# Patient Record
Sex: Female | Born: 1970
Health system: Southern US, Community
[De-identification: ages and names within clinical notes are randomized; demographics above are authoritative.]

## PROBLEM LIST (undated history)

## (undated) DIAGNOSIS — K219 Gastro-esophageal reflux disease without esophagitis: Secondary | ICD-10-CM

## (undated) DIAGNOSIS — J45909 Unspecified asthma, uncomplicated: Secondary | ICD-10-CM

## (undated) DIAGNOSIS — T7840XA Allergy, unspecified, initial encounter: Secondary | ICD-10-CM

## (undated) DIAGNOSIS — G4730 Sleep apnea, unspecified: Secondary | ICD-10-CM

## (undated) DIAGNOSIS — G629 Polyneuropathy, unspecified: Secondary | ICD-10-CM

## (undated) DIAGNOSIS — G709 Myoneural disorder, unspecified: Secondary | ICD-10-CM

## (undated) DIAGNOSIS — E119 Type 2 diabetes mellitus without complications: Secondary | ICD-10-CM

## (undated) DIAGNOSIS — F419 Anxiety disorder, unspecified: Secondary | ICD-10-CM

## (undated) DIAGNOSIS — E785 Hyperlipidemia, unspecified: Secondary | ICD-10-CM

## (undated) HISTORY — PX: MANDIBLE FRACTURE SURGERY: SHX706

## (undated) HISTORY — DX: Myoneural disorder, unspecified: G70.9

## (undated) HISTORY — DX: Sleep apnea, unspecified: G47.30

## (undated) HISTORY — PX: CHOLECYSTECTOMY: SHX55

## (undated) HISTORY — PX: SINUSOTOMY: SHX291

## (undated) HISTORY — DX: Hyperlipidemia, unspecified: E78.5

## (undated) HISTORY — DX: Gastro-esophageal reflux disease without esophagitis: K21.9

## (undated) HISTORY — DX: Allergy, unspecified, initial encounter: T78.40XA

## (undated) HISTORY — DX: Type 2 diabetes mellitus without complications: E11.9

## (undated) HISTORY — DX: Anxiety disorder, unspecified: F41.9

## (undated) HISTORY — DX: Polyneuropathy, unspecified: G62.9

## (undated) HISTORY — PX: ABDOMINAL HYSTERECTOMY: SHX81

## (undated) HISTORY — DX: Unspecified asthma, uncomplicated: J45.909

---

## 2008-09-05 HISTORY — PX: TOTAL ABDOMINAL HYSTERECTOMY: SHX209

## 2019-10-23 ENCOUNTER — Other Ambulatory Visit (HOSPITAL_COMMUNITY): Payer: Self-pay | Admitting: Family Medicine

## 2019-12-04 MED FILL — TRULICITY 1.5 MG/0.5 ML PEN: 1.5 | 84 days supply | Qty: 6 | Fill #0

## 2019-12-04 MED FILL — PREGABALIN 100 MG CAPS: 100 | 90 days supply | Qty: 270 | Fill #0

## 2019-12-04 MED FILL — CITALOPRAM HBR 40 MG TABLET: 40 | 90 days supply | Qty: 90 | Fill #0

## 2019-12-04 MED FILL — FENOFIBRATE 145 MG TABS: 145 | 90 days supply | Qty: 90 | Fill #0

## 2020-02-11 MED FILL — FLUTICASONE PROP 50 MCG SPR: 50 | 59 days supply | Qty: 16 | Fill #0

## 2020-02-11 MED FILL — ALBUTEROL SULFATE HFA 108 (: 108 (90 BAS | 24 days supply | Qty: 18 | Fill #0

## 2020-02-20 ENCOUNTER — Ambulatory Visit: Payer: Self-pay | Admitting: Family Medicine

## 2020-03-27 MED FILL — CITALOPRAM HBR 40 MG TABLET: 40 | 90 days supply | Qty: 90 | Fill #1

## 2020-03-27 MED FILL — ALBUTEROL SULFATE HFA 108 (: 108 (90 BAS | 24 days supply | Qty: 18 | Fill #1

## 2020-03-27 MED FILL — TRULICITY 1.5 MG/0.5 ML PEN: 1.5 | 84 days supply | Qty: 6 | Fill #1

## 2020-03-27 MED FILL — FENOFIBRATE 145 MG TABS: 145 | 90 days supply | Qty: 90 | Fill #1

## 2020-03-27 MED FILL — PREGABALIN 100 MG CAPS: 100 | 90 days supply | Qty: 270 | Fill #1

## 2020-04-08 ENCOUNTER — Other Ambulatory Visit: Payer: Self-pay

## 2020-04-09 ENCOUNTER — Other Ambulatory Visit (INDEPENDENT_AMBULATORY_CARE_PROVIDER_SITE_OTHER): Payer: 59

## 2020-04-09 ENCOUNTER — Encounter: Payer: Self-pay | Admitting: Family Medicine

## 2020-04-09 ENCOUNTER — Other Ambulatory Visit: Payer: Self-pay | Admitting: Family Medicine

## 2020-04-09 ENCOUNTER — Ambulatory Visit (INDEPENDENT_AMBULATORY_CARE_PROVIDER_SITE_OTHER): Payer: 59 | Admitting: Family Medicine

## 2020-04-09 VITALS — BP 138/82 | HR 85 | Temp 97.9°F | Ht 66.5 in | Wt 214.4 lb

## 2020-04-09 DIAGNOSIS — E1169 Type 2 diabetes mellitus with other specified complication: Secondary | ICD-10-CM | POA: Diagnosis not present

## 2020-04-09 DIAGNOSIS — E785 Hyperlipidemia, unspecified: Secondary | ICD-10-CM | POA: Diagnosis not present

## 2020-04-09 DIAGNOSIS — E119 Type 2 diabetes mellitus without complications: Secondary | ICD-10-CM

## 2020-04-09 DIAGNOSIS — Z1321 Encounter for screening for nutritional disorder: Secondary | ICD-10-CM | POA: Diagnosis not present

## 2020-04-09 DIAGNOSIS — E559 Vitamin D deficiency, unspecified: Secondary | ICD-10-CM | POA: Insufficient documentation

## 2020-04-09 DIAGNOSIS — J0100 Acute maxillary sinusitis, unspecified: Secondary | ICD-10-CM

## 2020-04-09 DIAGNOSIS — Z0000 Encounter for general adult medical examination without abnormal findings: Secondary | ICD-10-CM

## 2020-04-09 DIAGNOSIS — Z1231 Encounter for screening mammogram for malignant neoplasm of breast: Secondary | ICD-10-CM

## 2020-04-09 LAB — BASIC METABOLIC PANEL
BUN: 14 mg/dL (ref 6–23)
CO2: 26 mEq/L (ref 19–32)
Calcium: 10 mg/dL (ref 8.4–10.5)
Chloride: 105 mEq/L (ref 96–112)
Creatinine, Ser: 1.03 mg/dL (ref 0.40–1.20)
GFR: 63.62 mL/min (ref >60.00)
Glucose, Bld: 115 mg/dL — ABNORMAL HIGH (ref 70–99)
Potassium: 3.9 mEq/L (ref 3.5–5.1)
Sodium: 139 mEq/L (ref 135–145)

## 2020-04-09 LAB — LIPID PANEL
Cholesterol: 186 mg/dL (ref 0–200)
HDL: 37.7 mg/dL — ABNORMAL LOW (ref >39.00)
NonHDL: 148.12
Total CHOL/HDL Ratio: 5
Triglycerides: 220 mg/dL — ABNORMAL HIGH (ref 0.0–149.0)
VLDL: 44 mg/dL — ABNORMAL HIGH (ref 0.0–40.0)

## 2020-04-09 LAB — HEMOGLOBIN A1C: Hgb A1c MFr Bld: 7.7 % — ABNORMAL HIGH (ref 4.6–6.5)

## 2020-04-09 LAB — MICROALBUMIN / CREATININE URINE RATIO
Creatinine,U: 138.1 mg/dL
Microalb Creat Ratio: 0.5 mg/g (ref 0.0–30.0)
Microalb, Ur: <0.7 mg/dL (ref 0.0–1.9)

## 2020-04-09 LAB — ALT: ALT: 18 U/L (ref 0–35)

## 2020-04-09 LAB — AST: AST: 16 U/L (ref 0–37)

## 2020-04-09 LAB — CBC
HCT: 39.5 % (ref 36.0–46.0)
Hemoglobin: 13.4 g/dL (ref 12.0–15.0)
MCHC: 34 g/dL (ref 30.0–36.0)
MCV: 82.6 fl (ref 78.0–100.0)
Platelets: 282 10*3/uL (ref 150.0–400.0)
RBC: 4.78 Mil/uL (ref 3.87–5.11)
RDW: 13.4 % (ref 11.5–15.5)
WBC: 9.6 10*3/uL (ref 4.0–10.5)

## 2020-04-09 LAB — LDL CHOLESTEROL, DIRECT: Direct LDL: 114 mg/dL

## 2020-04-09 LAB — VITAMIN D 25 HYDROXY (VIT D DEFICIENCY, FRACTURES): VITD: 24.37 ng/mL — ABNORMAL LOW (ref 30.00–100.00)

## 2020-04-09 MED ORDER — AZITHROMYCIN 250 MG PO TABS: ORAL_TABLET | ORAL | 0 refills | Status: DC

## 2020-04-09 MED ORDER — PREDNISONE 20 MG PO TABS: 40.0000 mg | ORAL_TABLET | Freq: Every day | ORAL | 0 refills | Status: DC

## 2020-04-09 MED ORDER — TRULICITY 3 MG/0.5ML ~~LOC~~ SOAJ: 3.0000 mg | SUBCUTANEOUS | 3 refills | Status: DC

## 2020-04-09 MED ORDER — VITAMIN D (ERGOCALCIFEROL) 1.25 MG (50000 UNIT) PO CAPS: 50000.0000 [IU] | ORAL_CAPSULE | ORAL | 2 refills | Status: DC

## 2020-04-09 MED FILL — AZITHROMYCIN 250 MG TABLET: 250 | 5 days supply | Qty: 6 | Fill #0

## 2020-04-09 MED FILL — VIT D2 1.25 MG (50,000 UNIT: 1.25 MG | 28 days supply | Qty: 4 | Fill #0

## 2020-04-09 MED FILL — TRULICITY 3 MG/0.5ML SOPN: 3 | 84 days supply | Qty: 6 | Fill #0

## 2020-04-09 MED FILL — predniSONE 20 MG TABS: 20 | 5 days supply | Qty: 10 | Fill #0

## 2020-04-09 NOTE — Progress Notes (Signed)
Carolyn [name] is a 50 y.o. female  Chief Complaint  Patient presents with  . Establish Care    NP- CPE/labs.  Fasting today.  C/o having sinus pressure, nasal congestion x 8 days.         HPI: Carolyn [name] is a 50 y.o. female here as a new patient to establish care with our office and for annual CPE, fasting labs.   Last PAP: s/p TAH Last mammo: overdue Last colonoscopy: 2010 - would like to do cologuard  Dental: due - has not been since covid but plans for appt in 04/2020 Vision: appt scheduled in 05/2020  Med refills needed today? none  Today, pt complains of 8 day h/o nasal congestion, sinus pressure. No fever, chills. No loss of taste or smell. No sore throat, headache, myalgias, GI symptoms. Pt did have a cough lst week that has improved. She has been taking flonase, benadryl allergy, sinus rinse.  Other family members with symptoms - all covid negative and all diagnosed with sinus infections. Pt has not been tested. Pt lives with her mother.   Past Medical History:  Diagnosis Date  . Asthma   . Diabetes mellitus without complication ([place])   . Hyperlipidemia   . Neuropathy     Past Surgical History:  Procedure Laterality Date  . ABDOMINAL HYSTERECTOMY    . CHOLECYSTECTOMY    . MANDIBLE FRACTURE SURGERY    . SINUSOTOMY     x2    Social History   Socioeconomic History  . Marital status: Single    Spouse name: Not on file  . Number of children: Not on file  . Years of education: Not on file  . Highest education level: Not on file  Occupational History  . Not on file  Tobacco Use  . Smoking status: Never Smoker  . Smokeless tobacco: Never Used  Vaping Use  . Vaping Use: Never used  Substance and Sexual Activity  . Alcohol use: Yes  . Drug use: Never  . Sexual activity: Yes  Other Topics Concern  . Not on file  Social History Narrative  . Not on file   Social Determinants of Health   Financial Resource Strain: Not on file  Food Insecurity: Not on  file  Transportation Needs: Not on file  Physical Activity: Not on file  Stress: Not on file  Social Connections: Not on file  Intimate Partner Violence: Not on file    Family History  Problem Relation Age of Onset  . Depression Mother   . Hyperlipidemia Mother   . Cancer Father        renal cancer     Immunization History  Administered Date(s) Administered  . PFIZER SARS-COV-2 Vaccination 03/12/2019, 04/11/2019    Outpatient Encounter Medications as of 04/09/2020  Medication Sig  . albuterol (VENTOLIN HFA) 108 (90 Base) MCG/ACT inhaler Inhale into the lungs.  . Blood Glucose Monitoring Suppl (GLUCOCOM BLOOD GLUCOSE MONITOR) DEVI USE TO CHECK BLOOD SUGAR  . citalopram (CELEXA) 40 MG tablet Take 40 mg by mouth daily.  . fenofibrate (TRICOR) 145 MG tablet Take 145 mg by mouth daily.  . fluticasone (FLONASE) 50 MCG/ACT nasal spray   . glucose blood (PRECISION QID TEST) test strip USE CONTOUR NEXT TEST STRIPS TO CHECK BLOOD SUGAR ONCE DAILY  . Lancets MISC by Does not apply route.  . pregabalin (LYRICA) 100 MG capsule Take 100 mg by mouth 3 (three) times daily.  . TRULICITY 1.5 SH/7.50YO SOPN Inject 1.5 mg  into the skin once a week.   No facility-administered encounter medications on file as of 04/09/2020.   Immunization History  Administered Date(s) Administered  . PFIZER SARS-COV-2 Vaccination 03/12/2019, 04/11/2019    ROS: Gen: no fever, chills  Skin: no rash, itching ENT: as above in HPI Eyes: no blurry vision, double vision Resp: no cough, wheeze,SOB Breast: no breast tenderness, no nipple discharge, no breast masses CV: no CP, palpitations, LE edema,  GI: no heartburn, n/v/d/c, abd pain GU: no dysuria, urgency, frequency, hematuria; no vaginal itching, odor, discharge MSK: no joint pain, myalgias, back pain Neuro: no dizziness, headache, weakness, vertigo Psych: no depression, anxiety, insomnia   Allergies  Allergen Reactions  . Statins     Stomach cramps/ SOB     BP 138/82   Pulse 85   Temp 97.9 F (36.6 C) (Temporal)   Ht 5' 6.5" (1.689 m)   Wt 214 lb 6.4 oz (97.3 kg)   SpO2 94%   BMI 34.09 kg/m   Physical Exam Constitutional:      General: She is not in acute distress.    Appearance: She is well-developed and well-nourished.  HENT:     Head: Normocephalic and atraumatic.     Right Ear: Tympanic membrane and ear canal normal.     Left Ear: Tympanic membrane and ear canal normal.     Nose: Congestion present.     Right Turbinates: Swollen.     Left Turbinates: Swollen.     Right Sinus: Maxillary sinus tenderness present.     Left Sinus: Maxillary sinus tenderness present.     Mouth/Throat:     Mouth: Oropharynx is clear and moist and mucous membranes are normal.  Eyes:     Conjunctiva/sclera: Conjunctivae normal.     Pupils: Pupils are equal, round, and reactive to light.  Neck:     Thyroid: No thyromegaly.  Cardiovascular:     Rate and Rhythm: Normal rate and regular rhythm.     Pulses: Intact distal pulses.     Heart sounds: Normal heart sounds. No murmur heard.   Pulmonary:     Effort: Pulmonary effort is normal. No respiratory distress.     Breath sounds: Normal breath sounds. No wheezing or rhonchi.  Abdominal:     General: Bowel sounds are normal. There is no distension.     Palpations: Abdomen is soft. There is no mass.     Tenderness: There is no abdominal tenderness.  Musculoskeletal:        General: No edema.     Cervical back: Neck supple.     Right lower leg: No edema.     Left lower leg: No edema.  Lymphadenopathy:     Cervical: No cervical adenopathy.  Skin:    General: Skin is warm and dry.  Neurological:     Mental Status: She is alert and oriented to person, place, and time.     Motor: No abnormal muscle tone.     Coordination: Coordination normal.  Psychiatric:        Mood and Affect: Mood and affect normal.        Behavior: Behavior normal.      A/P:  1. Annual physical exam - discussed  importance of regular CV exercise, healthy diet, adequate sleep - due for mammo - referral placed today; due for colonoscopy but pt declines but agrees to cologuard; s/p TAH - dental and vision appts scheduled - ALT - AST - Basic metabolic panel - CBC -  Lipid panel - next CPE in 1 year  2. Encounter for vitamin deficiency screening - VITAMIN D 25 Hydroxy (Vit-D Deficiency, Fractures)  3. Type 2 diabetes mellitus without complication, without long-term current use of insulin (HCC) - last A1C about 6 mo ago and pt reports it was around 8.0 - for now continue trulicity 1.5mg  weekly - Lipid panel - Hemoglobin A1c - Microalbumin / creatinine urine ratio  4. Hyperlipidemia associated with type 2 diabetes mellitus ([place]) - on fenofibrate 145mg  daily, unable to tolerate statin - Lipid panel  5. Encounter for screening mammogram for malignant neoplasm of breast - MM DIGITAL SCREENING BILATERAL; Future  6. Acute non-recurrent maxillary sinusitis - symptoms x 8 days, minimal improvement - pt has h/o sinus issues but states this amount of sinus pressure is unusual for her - strongly encouraged pt get tested for covid since she lives with her elderly mother - discussed local testing locations - cont with flonase Rx: - predniSONE (DELTASONE) 20 MG tablet; Take 2 tablets (40 mg total) by mouth daily with breakfast for 5 days.  Dispense: 10 tablet; Refill: 0 - azithromycin (ZITHROMAX) 250 MG tablet; 2 tabs po x 1 day and then 1 tab po daily  Dispense: 6 tablet; Refill: 0 Discussed plan and reviewed medications with patient, including risks, benefits, and potential side effects. Pt expressed understand. All questions answered.   This visit occurred during the SARS-CoV-2 public health emergency.  Safety protocols were in place, including screening questions prior to the visit, additional usage of staff PPE, and extensive cleaning of exam room while observing appropriate contact time as indicated  for disinfecting solutions.

## 2020-04-09 NOTE — Patient Instructions (Signed)
Health Maintenance, Female Adopting a healthy lifestyle and getting preventive care are important in promoting health and wellness. Ask your health care provider about:  The right schedule for you to have regular tests and exams.  Things you can do on your own to prevent diseases and keep yourself healthy. What should I know about diet, weight, and exercise? Eat a healthy diet  Eat a diet that includes plenty of vegetables, fruits, low-fat dairy products, and lean protein.  Do not eat a lot of foods that are high in solid fats, added sugars, or sodium.   Maintain a healthy weight Body mass index (BMI) is used to identify weight problems. It estimates body fat based on height and weight. Your health care provider can help determine your BMI and help you achieve or maintain a healthy weight. Get regular exercise Get regular exercise. This is one of the most important things you can do for your health. Most adults should:  Exercise for at least 150 minutes each week. The exercise should increase your heart rate and make you sweat (moderate-intensity exercise).  Do strengthening exercises at least twice a week. This is in addition to the moderate-intensity exercise.  Spend less time sitting. Even light physical activity can be beneficial. Watch cholesterol and blood lipids Have your blood tested for lipids and cholesterol at 50 years of age, then have this test every 5 years. Have your cholesterol levels checked more often if:  Your lipid or cholesterol levels are high.  You are older than 50 years of age.  You are at high risk for heart disease. What should I know about cancer screening? Depending on your health history and family history, you may need to have cancer screening at various ages. This may include screening for:  Breast cancer.  Cervical cancer.  Colorectal cancer.  Skin cancer.  Lung cancer. What should I know about heart disease, diabetes, and high blood  pressure? Blood pressure and heart disease  High blood pressure causes heart disease and increases the risk of stroke. This is more likely to develop in people who have high blood pressure readings, are of African descent, or are overweight.  Have your blood pressure checked: ? Every 3-5 years if you are 18-39 years of age. ? Every year if you are 40 years old or older. Diabetes Have regular diabetes screenings. This checks your fasting blood sugar level. Have the screening done:  Once every three years after age 40 if you are at a normal weight and have a low risk for diabetes.  More often and at a younger age if you are overweight or have a high risk for diabetes. What should I know about preventing infection? Hepatitis B If you have a higher risk for hepatitis B, you should be screened for this virus. Talk with your health care provider to find out if you are at risk for hepatitis B infection. Hepatitis C Testing is recommended for:  Everyone born from 1945 through 1965.  Anyone with known risk factors for hepatitis C. Sexually transmitted infections (STIs)  Get screened for STIs, including gonorrhea and chlamydia, if: ? You are sexually active and are younger than 50 years of age. ? You are older than 50 years of age and your health care provider tells you that you are at risk for this type of infection. ? Your sexual activity has changed since you were last screened, and you are at increased risk for chlamydia or gonorrhea. Ask your health care provider   if you are at risk.  Ask your health care provider about whether you are at high risk for HIV. Your health care provider may recommend a prescription medicine to help prevent HIV infection. If you choose to take medicine to prevent HIV, you should first get tested for HIV. You should then be tested every 3 months for as long as you are taking the medicine. Pregnancy  If you are about to stop having your period (premenopausal) and  you may become pregnant, seek counseling before you get pregnant.  Take 400 to 800 micrograms (mcg) of folic acid every day if you become pregnant.  Ask for birth control (contraception) if you want to prevent pregnancy. Osteoporosis and menopause Osteoporosis is a disease in which the bones lose minerals and strength with aging. This can result in bone fractures. If you are 65 years old or older, or if you are at risk for osteoporosis and fractures, ask your health care provider if you should:  Be screened for bone loss.  Take a calcium or vitamin D supplement to lower your risk of fractures.  Be given hormone replacement therapy (HRT) to treat symptoms of menopause. Follow these instructions at home: Lifestyle  Do not use any products that contain nicotine or tobacco, such as cigarettes, e-cigarettes, and chewing tobacco. If you need help quitting, ask your health care provider.  Do not use street drugs.  Do not share needles.  Ask your health care provider for help if you need support or information about quitting drugs. Alcohol use  Do not drink alcohol if: ? Your health care provider tells you not to drink. ? You are pregnant, may be pregnant, or are planning to become pregnant.  If you drink alcohol: ? Limit how much you use to 0-1 drink a day. ? Limit intake if you are breastfeeding.  Be aware of how much alcohol is in your drink. In the [place] one drink equals one 12 oz bottle of beer (355 mL), one 5 oz glass of wine (148 mL), or one 1 oz glass of hard liquor (44 mL). General instructions  Schedule regular health, dental, and eye exams.  Stay current with your vaccines.  Tell your health care provider if: ? You often feel depressed. ? You have ever been abused or do not feel safe at home. Summary  Adopting a healthy lifestyle and getting preventive care are important in promoting health and wellness.  Follow your health care provider's instructions about healthy  diet, exercising, and getting tested or screened for diseases.  Follow your health care provider's instructions on monitoring your cholesterol and blood pressure. This information is not intended to replace advice given to you by your health care provider. Make sure you discuss any questions you have with your health care provider. Document Revised: 03/08/2018 Document Reviewed: 03/08/2018 Elsevier Patient Education  2021 Elsevier Inc.  

## 2020-04-09 NOTE — Addendum Note (Signed)
Addended by: Overton Mam on: 04/09/2020 09:49 AM   Modules accepted: Orders

## 2020-05-26 ENCOUNTER — Encounter: Payer: Self-pay | Admitting: Family Medicine

## 2020-05-26 ENCOUNTER — Other Ambulatory Visit: Payer: Self-pay

## 2020-05-26 NOTE — Telephone Encounter (Signed)
Pt requesting refill on flonase. Chart supports Rx. Pt last seen 04/09/20.

## 2020-05-28 MED ORDER — FLUTICASONE PROPIONATE 50 MCG/ACT NA SUSP: 2.0000 | Freq: Every day | NASAL | 3 refills | Status: DC

## 2020-06-04 ENCOUNTER — Other Ambulatory Visit: Payer: Self-pay | Admitting: Family Medicine

## 2020-06-04 ENCOUNTER — Other Ambulatory Visit: Payer: Self-pay

## 2020-06-04 MED ORDER — FLUTICASONE PROPIONATE 50 MCG/ACT NA SUSP: 2.0000 | Freq: Every day | NASAL | 3 refills | Status: DC

## 2020-06-04 MED FILL — FLUTICASONE PROP 50 MCG SPR: 50 | 30 days supply | Qty: 16 | Fill #0

## 2020-06-19 ENCOUNTER — Other Ambulatory Visit (HOSPITAL_BASED_OUTPATIENT_CLINIC_OR_DEPARTMENT_OTHER): Payer: Self-pay

## 2020-06-20 ENCOUNTER — Other Ambulatory Visit (HOSPITAL_BASED_OUTPATIENT_CLINIC_OR_DEPARTMENT_OTHER): Payer: Self-pay

## 2020-06-27 ENCOUNTER — Other Ambulatory Visit: Payer: Self-pay | Admitting: Family Medicine

## 2020-06-27 MED FILL — CITALOPRAM HBR 40 MG TABLET: 40 | 90 days supply | Qty: 90 | Fill #0

## 2020-06-27 MED FILL — FENOFIBRATE 145 MG TABS: 145 | 90 days supply | Qty: 90 | Fill #0

## 2020-06-27 MED FILL — TRULICITY 3 MG/0.5ML SOPN: 3 | 84 days supply | Qty: 6 | Fill #1

## 2020-06-27 MED FILL — ALBUTEROL SULFATE HFA 108 (: 108 (90 BAS | 24 days supply | Qty: 18 | Fill #2

## 2020-06-28 ENCOUNTER — Other Ambulatory Visit (HOSPITAL_COMMUNITY): Payer: Self-pay

## 2020-06-28 MED FILL — Pregabalin Cap 100 MG: ORAL | 90 days supply | Qty: 270 | Fill #0 | Status: AC

## 2020-06-29 ENCOUNTER — Other Ambulatory Visit (HOSPITAL_COMMUNITY): Payer: Self-pay

## 2020-06-30 ENCOUNTER — Other Ambulatory Visit (HOSPITAL_COMMUNITY): Payer: Self-pay

## 2020-07-02 ENCOUNTER — Other Ambulatory Visit (HOSPITAL_COMMUNITY): Payer: Self-pay

## 2020-07-03 ENCOUNTER — Other Ambulatory Visit (HOSPITAL_COMMUNITY): Payer: Self-pay

## 2020-08-11 MED FILL — Fluticasone Propionate Nasal Susp 50 MCG/ACT: NASAL | 30 days supply | Qty: 16 | Fill #0 | Status: AC

## 2020-08-12 ENCOUNTER — Other Ambulatory Visit (HOSPITAL_COMMUNITY): Payer: Self-pay

## 2020-09-18 ENCOUNTER — Other Ambulatory Visit: Payer: Self-pay | Admitting: Family Medicine

## 2020-09-18 ENCOUNTER — Other Ambulatory Visit (HOSPITAL_COMMUNITY): Payer: Self-pay

## 2020-09-18 MED ORDER — ALBUTEROL SULFATE HFA 108 (90 BASE) MCG/ACT IN AERS
INHALATION_SPRAY | RESPIRATORY_TRACT | 0 refills | Status: DC
  Filled 2020-09-18: qty 54, 75d supply, fill #0

## 2020-09-18 MED FILL — Fluticasone Propionate Nasal Susp 50 MCG/ACT: NASAL | 30 days supply | Qty: 16 | Fill #1 | Status: AC

## 2020-09-18 MED FILL — Fenofibrate Tab 145 MG: ORAL | 90 days supply | Qty: 90 | Fill #0 | Status: AC

## 2020-09-18 MED FILL — Citalopram Hydrobromide Tab 40 MG (Base Equiv): ORAL | 90 days supply | Qty: 90 | Fill #0 | Status: AC

## 2020-09-18 MED FILL — Dulaglutide Soln Auto-injector 3 MG/0.5ML: SUBCUTANEOUS | 84 days supply | Qty: 6 | Fill #0 | Status: AC

## 2020-10-06 ENCOUNTER — Other Ambulatory Visit (HOSPITAL_COMMUNITY): Payer: Self-pay

## 2020-10-31 ENCOUNTER — Ambulatory Visit: Payer: 59 | Admitting: Medical-Surgical

## 2020-10-31 ENCOUNTER — Other Ambulatory Visit (HOSPITAL_COMMUNITY): Payer: Self-pay

## 2020-10-31 ENCOUNTER — Encounter: Payer: Self-pay | Admitting: Medical-Surgical

## 2020-10-31 ENCOUNTER — Other Ambulatory Visit: Payer: Self-pay

## 2020-10-31 VITALS — BP 133/89 | HR 78 | Resp 20 | Wt 216.7 lb

## 2020-10-31 DIAGNOSIS — Z7689 Persons encountering health services in other specified circumstances: Secondary | ICD-10-CM | POA: Diagnosis not present

## 2020-10-31 DIAGNOSIS — E559 Vitamin D deficiency, unspecified: Secondary | ICD-10-CM

## 2020-10-31 DIAGNOSIS — Z23 Encounter for immunization: Secondary | ICD-10-CM

## 2020-10-31 DIAGNOSIS — Z1329 Encounter for screening for other suspected endocrine disorder: Secondary | ICD-10-CM

## 2020-10-31 DIAGNOSIS — F418 Other specified anxiety disorders: Secondary | ICD-10-CM | POA: Diagnosis not present

## 2020-10-31 DIAGNOSIS — E1169 Type 2 diabetes mellitus with other specified complication: Secondary | ICD-10-CM

## 2020-10-31 DIAGNOSIS — E1159 Type 2 diabetes mellitus with other circulatory complications: Secondary | ICD-10-CM

## 2020-10-31 DIAGNOSIS — Z1231 Encounter for screening mammogram for malignant neoplasm of breast: Secondary | ICD-10-CM | POA: Diagnosis not present

## 2020-10-31 DIAGNOSIS — I152 Hypertension secondary to endocrine disorders: Secondary | ICD-10-CM | POA: Diagnosis not present

## 2020-10-31 DIAGNOSIS — K219 Gastro-esophageal reflux disease without esophagitis: Secondary | ICD-10-CM

## 2020-10-31 DIAGNOSIS — E785 Hyperlipidemia, unspecified: Secondary | ICD-10-CM | POA: Diagnosis not present

## 2020-10-31 DIAGNOSIS — E119 Type 2 diabetes mellitus without complications: Secondary | ICD-10-CM

## 2020-10-31 HISTORY — DX: Hypertension secondary to endocrine disorders: I15.2

## 2020-10-31 HISTORY — DX: Type 2 diabetes mellitus with other circulatory complications: E11.59

## 2020-10-31 HISTORY — DX: Other specified anxiety disorders: F41.8

## 2020-10-31 LAB — POCT GLYCOSYLATED HEMOGLOBIN (HGB A1C): HbA1c, POC (controlled diabetic range): 9.8 % — AB (ref 0.0–7.0)

## 2020-10-31 MED ORDER — SEMAGLUTIDE 14 MG PO TABS
1.0000 | ORAL_TABLET | Freq: Every day | ORAL | 11 refills | Status: DC
  Filled 2020-10-31: qty 30, 30d supply, fill #0
  Filled 2020-11-26: qty 30, 30d supply, fill #1
  Filled 2020-12-28: qty 30, 30d supply, fill #2
  Filled 2021-01-25: qty 30, 30d supply, fill #3
  Filled 2021-02-27: qty 30, 30d supply, fill #4
  Filled 2021-04-04: qty 30, 30d supply, fill #5
  Filled 2021-05-03: qty 30, 30d supply, fill #6
  Filled 2021-06-03: qty 30, 30d supply, fill #7
  Filled 2021-07-08: qty 30, 30d supply, fill #8

## 2020-10-31 MED ORDER — PANTOPRAZOLE SODIUM 40 MG PO TBEC
40.0000 mg | DELAYED_RELEASE_TABLET | Freq: Every day | ORAL | 3 refills | Status: DC
  Filled 2020-10-31: qty 30, 30d supply, fill #0
  Filled 2020-11-26: qty 30, 30d supply, fill #1
  Filled 2020-12-28: qty 30, 30d supply, fill #2
  Filled 2021-01-25: qty 30, 30d supply, fill #3

## 2020-10-31 MED FILL — Fluticasone Propionate Nasal Susp 50 MCG/ACT: NASAL | 30 days supply | Qty: 16 | Fill #2 | Status: AC

## 2020-10-31 NOTE — Progress Notes (Signed)
New Patient Office Visit  Subjective:  Patient ID: Carolyn [name], female    DOB: 02/20/1971  Age: 50 y.o. MRN: [id number]  CC:  Chief Complaint  Patient presents with   Establish Care    HPI Carolyn [name] presents to establish care.  DM- currently taking Dulaglutide 3mg  weekly as prescribed. Not checking sugars at home but does have a glucometer. Not compliant with recommended diabetic diet. No current exercise. Took Metformin in the past but stopped it when she went on the shot. Is worried about the mixed information regarding Metformin safety and side effects.   HTN- Has a cuff at home but does not check her pressure. Previously treated with HCTZ 25mg  daily but has been off this for years.  HLD- Statin intolerance. Taking Tricor as prescribed.   GERD- Issues with reflux. Previously took Prilosec but it is no longer covered by insurance. Currently having to use Gaviscon and Tagamet on a daily basis.   Anxiety- Taking Celexa 40mg  daily, tolerating well without side effects. Feels this does okay for her mood although she still has some trouble getting to sleep at night.   Asthma- Has been using Albuterol and Qvar twice daily along with oral antihistamines. Doing okay on this regimen.   Neuropathy- taking Lyrica 100mg  in the morning with 200mg  at night. Work fairly well for her.  Past Medical History:  Diagnosis Date   Allergy 1973   Anxiety    Asthma    Diabetes mellitus without complication (HCC)    GERD (gastroesophageal reflux disease)    Hyperlipidemia    Neuromuscular disorder (HCC) 2011   Feet   Neuropathy    Sleep apnea     Past Surgical History:  Procedure Laterality Date   ABDOMINAL HYSTERECTOMY     CHOLECYSTECTOMY     MANDIBLE FRACTURE SURGERY     SINUSOTOMY     x2    Family History  Problem Relation Age of Onset   Depression Mother    Hyperlipidemia Mother    Arthritis Mother    Asthma Mother    Diabetes Mother    Hypertension Mother    Kidney  disease Mother    Cancer Father        renal cancer   Hearing loss Father    Kidney disease Father    Obesity Father    Diabetes Sister    Obesity Sister     Social History   Socioeconomic History   Marital status: Single    Spouse name: Not on file   Number of children: Not on file   Years of education: Not on file   Highest education level: Not on file  Occupational History   Not on file  Tobacco Use   Smoking status: Never   Smokeless tobacco: Never  Vaping Use   Vaping Use: Never used  Substance and Sexual Activity   Alcohol use: Not Currently    Alcohol/week: 2.0 standard drinks    Types: 1 Cans of beer, 1 Shots of liquor per week    Comment: Liquor to sleep   Drug use: Never   Sexual activity: Not Currently    Birth control/protection: Abstinence  Other Topics Concern   Not on file  Social History Narrative   Not on file   Social Determinants of Health   Financial Resource Strain: Not on file  Food Insecurity: Not on file  Transportation Needs: Not on file  Physical Activity: Not on file  Stress: Not on file  Social Connections: Not on file  Intimate Partner Violence: Not on file    ROS Review of Systems  Constitutional:  Negative for chills, fatigue, fever and unexpected weight change.  Respiratory:  Negative for cough, chest tightness, shortness of breath and wheezing.   Cardiovascular:  Negative for chest pain, palpitations and leg swelling.  Gastrointestinal:  Negative for abdominal pain, blood in stool, constipation, diarrhea, nausea and vomiting.       Frequent heartburn  Neurological:  Negative for dizziness, light-headedness and headaches.  Psychiatric/Behavioral:  Positive for sleep disturbance. Negative for dysphoric mood, self-injury and suicidal ideas. The patient is nervous/anxious.    Objective:   Today's Vitals: BP (!) 144/91 (BP Location: Left Arm, Patient Position: Sitting, Cuff Size: Normal)   Pulse 78   Resp 20   Wt 216 lb 11.2  oz (98.3 kg)   SpO2 96%   BMI 34.45 kg/m   Physical Exam Vitals reviewed.  Constitutional:      General: She is not in acute distress.    Appearance: Normal appearance. She is obese. She is not ill-appearing.  HENT:     Head: Normocephalic and atraumatic.  Cardiovascular:     Rate and Rhythm: Normal rate and regular rhythm.     Pulses: Normal pulses.     Heart sounds: Normal heart sounds. No murmur heard.   No friction rub. No gallop.  Pulmonary:     Effort: Pulmonary effort is normal. No respiratory distress.     Breath sounds: Normal breath sounds. No wheezing.  Skin:    General: Skin is warm and dry.  Neurological:     Mental Status: She is alert and oriented to person, place, and time.  Psychiatric:        Mood and Affect: Mood normal.        Behavior: Behavior normal.        Thought Content: Thought content normal.        Judgment: Judgment normal.    Assessment & Plan:   1. Encounter to establish care Reviewed available information and discussed care concerns with patient.   2. Type 2 diabetes mellitus without complication, without long-term current use of insulin (HCC) POCT HgbA1c 9.8% today, up from 7.7% in January. Strongly recommend resuming diabetic diet restrictions as well as monitoring glucose. Discussed treatment with Metformin. After discussion, patient agreeable to restart so sending in 500mg  XR once daily for now. She does not like doing the weekly injection and would prefer a daily pill if possible. Switching from Dulaglutide to Rybelsus 14mg  daily.  - POCT HgB A1C  3. Hyperlipidemia associated with type 2 diabetes mellitus (HCC) Checking lipid panel today. Continue Tricor and fish oil .  - Lipid panel  4. Hypertension associated with diabetes (HCC) Checking CBC w/diff and CMP. Recheck of BP better at 133/98. Recommend limiting dietary sodium and working to incorporate exercise/diet changes.  - CBC with Differential/Platelet - COMPLETE METABOLIC PANEL  WITH GFR  5. Vitamin D deficiency Checking vitamin D.  - VITAMIN D 25 Hydroxy (Vit-D Deficiency, Fractures)  6. Anxiety with depression PHQ9/GAD7 scores stable. Continue Celexa 40mg  daily. If this does not seem to be working as well, we may need to discuss other treatment options.   7. Thyroid disorder screen Checking TSH.  - TSH  8. Encounter for screening mammogram for malignant neoplasm of breast Mammogram ordered.  - MM 3D SCREEN BREAST BILATERAL; Future  9. Need for shingles vaccine Shingrix #1 given in office today. Next will  be due in 2-6 months. Ok to schedule as a nurse visit for completion.  - Varicella-zoster vaccine IM  10. GERD Start Protonix 40mg  daily prn.    Outpatient Encounter Medications as of 10/31/2020  Medication Sig   pantoprazole (PROTONIX) 40 MG tablet Take 1 tablet (40 mg total) by mouth daily.   albuterol (VENTOLIN HFA) 108 (90 Base) MCG/ACT inhaler Inhale into the lungs.   albuterol (VENTOLIN HFA) 108 (90 Base) MCG/ACT inhaler INHALE 2 PUFFS INTO THE LUNGS EVERY 6 HOURS AS NEEDED FOR WHEEZING   azithromycin (ZITHROMAX) 250 MG tablet TAKE 2 TABLETS BY MOUTH ON DAY 1 THEN TAKE 1 TABLET DAILY FOR THE NEXT 4 DAYS   citalopram (CELEXA) 40 MG tablet TAKE 1 TABLET BY MOUTH ONCE A DAY   Dulaglutide 3 MG/0.5ML SOPN INJECT 3 MG AS DIRECTED ONCE A WEEK.   fenofibrate (TRICOR) 145 MG tablet TAKE 1 TABLET BY MOUTH ONCE A DAY   fluticasone (FLONASE) 50 MCG/ACT nasal spray PLACE 2 SPRAYS INTO BOTH NOSTRILS DAILY.   Lancets MISC by Does not apply route.   predniSONE (DELTASONE) 20 MG tablet TAKE 2 TABLETS BY MOUTH ONCE A DAY WITH BREAKFAST FOR 5 DAYS   pregabalin (LYRICA) 100 MG capsule TAKE 1 CAPSULE BY MOUTH 3 TIMES DAILY   Vitamin D, Ergocalciferol, (DRISDOL) 1.25 MG (50000 UNIT) CAPS capsule TAKE 1 CAPSULE BY MOUTH EVERY 7 DAYS   No facility-administered encounter medications on file as of 10/31/2020.    Follow-up: Return in about 4 weeks (around 11/28/2020) for  gerd follow up.   01/28/2021, DNP, APRN, FNP-BC [facility]

## 2020-11-01 LAB — CBC WITH DIFFERENTIAL/PLATELET
Absolute Monocytes: 618 cells/uL (ref 200–950)
Basophils Absolute: 52 cells/uL (ref 0–200)
Basophils Relative: 0.5 %
Eosinophils Absolute: 206 cells/uL (ref 15–500)
Eosinophils Relative: 2 %
HCT: 45.3 % — ABNORMAL HIGH (ref 35.0–45.0)
Hemoglobin: 15.2 g/dL (ref 11.7–15.5)
Lymphs Abs: 3996 cells/uL — ABNORMAL HIGH (ref 850–3900)
MCH: 28.2 pg (ref 27.0–33.0)
MCHC: 33.6 g/dL (ref 32.0–36.0)
MCV: 84 fL (ref 80.0–100.0)
MPV: 11.4 fL (ref 7.5–12.5)
Monocytes Relative: 6 %
Neutro Abs: 5428 cells/uL (ref 1500–7800)
Neutrophils Relative %: 52.7 %
Platelets: 311 10*3/uL (ref 140–400)
RBC: 5.39 10*6/uL — ABNORMAL HIGH (ref 3.80–5.10)
RDW: 12.6 % (ref 11.0–15.0)
Total Lymphocyte: 38.8 %
WBC: 10.3 10*3/uL (ref 3.8–10.8)

## 2020-11-01 LAB — COMPLETE METABOLIC PANEL WITH GFR
AG Ratio: 1.9 (calc) (ref 1.0–2.5)
ALT: 25 U/L (ref 6–29)
AST: 23 U/L (ref 10–35)
Albumin: 4.9 g/dL (ref 3.6–5.1)
Alkaline phosphatase (APISO): 91 U/L (ref 37–153)
BUN/Creatinine Ratio: 11 (calc) (ref 6–22)
BUN: 12 mg/dL (ref 7–25)
CO2: 24 mmol/L (ref 20–32)
Calcium: 10 mg/dL (ref 8.6–10.4)
Chloride: 104 mmol/L (ref 98–110)
Creat: 1.13 mg/dL — ABNORMAL HIGH (ref 0.50–1.03)
Globulin: 2.6 g/dL (calc) (ref 1.9–3.7)
Glucose, Bld: 162 mg/dL — ABNORMAL HIGH (ref 65–99)
Potassium: 4.4 mmol/L (ref 3.5–5.3)
Sodium: 140 mmol/L (ref 135–146)
Total Bilirubin: 0.5 mg/dL (ref 0.2–1.2)
Total Protein: 7.5 g/dL (ref 6.1–8.1)
eGFR: 59 mL/min/{1.73_m2} — ABNORMAL LOW (ref >=60)

## 2020-11-01 LAB — LIPID PANEL
Cholesterol: 220 mg/dL — ABNORMAL HIGH (ref <200)
HDL: 43 mg/dL — ABNORMAL LOW (ref >=50)
LDL Cholesterol (Calc): 137 mg/dL (calc) — ABNORMAL HIGH
Non-HDL Cholesterol (Calc): 177 mg/dL (calc) — ABNORMAL HIGH (ref <130)
Total CHOL/HDL Ratio: 5.1 (calc) — ABNORMAL HIGH (ref <5.0)
Triglycerides: 260 mg/dL — ABNORMAL HIGH (ref <150)

## 2020-11-01 LAB — VITAMIN D 25 HYDROXY (VIT D DEFICIENCY, FRACTURES): Vit D, 25-Hydroxy: 73 ng/mL (ref 30–100)

## 2020-11-01 LAB — TSH: TSH: 1.44 mIU/L

## 2020-11-03 ENCOUNTER — Encounter: Payer: Self-pay | Admitting: Medical-Surgical

## 2020-11-26 ENCOUNTER — Other Ambulatory Visit (HOSPITAL_COMMUNITY): Payer: Self-pay

## 2020-11-26 ENCOUNTER — Other Ambulatory Visit: Payer: Self-pay | Admitting: Medical-Surgical

## 2020-11-26 ENCOUNTER — Other Ambulatory Visit: Payer: Self-pay | Admitting: Family Medicine

## 2020-11-26 MED FILL — Pregabalin Cap 100 MG: ORAL | 90 days supply | Qty: 270 | Fill #1 | Status: AC

## 2020-11-27 ENCOUNTER — Other Ambulatory Visit (HOSPITAL_COMMUNITY): Payer: Self-pay

## 2020-11-27 MED ORDER — ALBUTEROL SULFATE HFA 108 (90 BASE) MCG/ACT IN AERS
2.0000 | INHALATION_SPRAY | Freq: Four times a day (QID) | RESPIRATORY_TRACT | 0 refills | Status: DC | PRN
  Filled 2020-11-27: qty 54, 75d supply, fill #0

## 2020-11-27 MED ORDER — FLUTICASONE PROPIONATE 50 MCG/ACT NA SUSP
2.0000 | Freq: Every day | NASAL | 0 refills | Status: DC
  Filled 2020-11-27: qty 16, 30d supply, fill #0

## 2020-12-02 ENCOUNTER — Other Ambulatory Visit (HOSPITAL_COMMUNITY): Payer: Self-pay

## 2020-12-02 ENCOUNTER — Encounter: Payer: Self-pay | Admitting: Medical-Surgical

## 2020-12-02 ENCOUNTER — Other Ambulatory Visit: Payer: Self-pay

## 2020-12-02 ENCOUNTER — Ambulatory Visit: Payer: 59 | Admitting: Medical-Surgical

## 2020-12-02 VITALS — BP 116/77 | HR 76 | Resp 20 | Ht 66.5 in | Wt 209.0 lb

## 2020-12-02 DIAGNOSIS — Z23 Encounter for immunization: Secondary | ICD-10-CM | POA: Diagnosis not present

## 2020-12-02 DIAGNOSIS — K219 Gastro-esophageal reflux disease without esophagitis: Secondary | ICD-10-CM

## 2020-12-02 DIAGNOSIS — G4700 Insomnia, unspecified: Secondary | ICD-10-CM

## 2020-12-02 NOTE — Progress Notes (Signed)
  HPI with pertinent ROS:   CC: GERD follow-up  HPI: Pleasant 50 year old female presenting today for GERD follow-up.  She previously took Prilosec but unfortunately, this was not covered by her insurance.  She had been taking over-the-counter Tagamet and Gaviscon only met 4 weeks ago.  We started her on Protonix 40 mg daily which is covered by her insurance.  She has been taking this as prescribed, tolerating well without side effects.  Notes that her reflux symptoms have resolved since she started the medication.  Having difficulty with sleeping.  Notes that she has trouble falling asleep as well as staying asleep.  Last night she woke 4 times.  She did not wear her CPAP last night but usually does wear it.  She has tried melatonin before bed a few times and it seemed to help a bit.  Has not tried any other over-the-counter or prescription medications to help with sleep.  I reviewed the past medical history, family history, social history, surgical history, and allergies today and no changes were needed.  Please see the problem list section below in epic for further details.   Physical exam:   General: Well Developed, well nourished, and in no acute distress.  Neuro: Alert and oriented x3.  HEENT: Normocephalic, atraumatic.  Skin: Warm and dry. Cardiac: Regular rate and rhythm, no murmurs rubs or gallops, no lower extremity edema.  Respiratory: Clear to auscultation bilaterally. Not using accessory muscles, speaking in full sentences. Abdomen: Soft, nontender, nondistended. Bowel sounds + x 4 quadrants. No HSM appreciated.  Impression and Recommendations:    1. Gastroesophageal reflux disease without esophagitis Continue Protonix 40 mg daily.  2. Mixed insomnia Reviewed over-the-counter and prescription options.  Okay to use melatonin up to 10 mg nightly.  Also consider using ZzzQuil or Unisom type medications.  Advised that these may make her a bit groggy in the mornings.  If so, take  it earlier in the evening or contact me and we will look at prescription options.  3. Flu vaccine need Flu vaccine given in office today. - Flu Vaccine QUAD 75moIM (Fluarix, Fluzone & Alfiuria Quad PF)  Return in about 2 months (around 02/01/2021) for DM follow up. ___________________________________________ JClearnce Sorrel DNP, APRN, FNP-BC [facility] [place]

## 2020-12-28 ENCOUNTER — Other Ambulatory Visit: Payer: Self-pay

## 2020-12-29 ENCOUNTER — Other Ambulatory Visit (HOSPITAL_COMMUNITY): Payer: Self-pay

## 2020-12-29 ENCOUNTER — Other Ambulatory Visit: Payer: Self-pay | Admitting: Medical-Surgical

## 2020-12-29 MED ORDER — FENOFIBRATE 145 MG PO TABS
ORAL_TABLET | Freq: Every day | ORAL | 1 refills | Status: DC
  Filled 2020-12-29: qty 90, 90d supply, fill #0
  Filled 2021-04-04: qty 90, 90d supply, fill #1

## 2021-01-06 ENCOUNTER — Encounter: Payer: Self-pay | Admitting: Medical-Surgical

## 2021-01-25 ENCOUNTER — Other Ambulatory Visit: Payer: Self-pay

## 2021-01-26 ENCOUNTER — Other Ambulatory Visit (HOSPITAL_COMMUNITY): Payer: Self-pay

## 2021-01-27 ENCOUNTER — Other Ambulatory Visit: Payer: Self-pay

## 2021-01-27 ENCOUNTER — Other Ambulatory Visit (HOSPITAL_COMMUNITY): Payer: Self-pay

## 2021-01-28 ENCOUNTER — Other Ambulatory Visit (HOSPITAL_COMMUNITY): Payer: Self-pay

## 2021-02-04 ENCOUNTER — Other Ambulatory Visit: Payer: Self-pay

## 2021-02-04 ENCOUNTER — Encounter: Payer: Self-pay | Admitting: Medical-Surgical

## 2021-02-04 ENCOUNTER — Ambulatory Visit: Payer: 59 | Admitting: Medical-Surgical

## 2021-02-04 VITALS — BP 120/82 | HR 73 | Resp 20 | Ht 66.5 in | Wt 193.9 lb

## 2021-02-04 DIAGNOSIS — E119 Type 2 diabetes mellitus without complications: Secondary | ICD-10-CM

## 2021-02-04 DIAGNOSIS — I152 Hypertension secondary to endocrine disorders: Secondary | ICD-10-CM

## 2021-02-04 DIAGNOSIS — Z23 Encounter for immunization: Secondary | ICD-10-CM | POA: Diagnosis not present

## 2021-02-04 DIAGNOSIS — E1159 Type 2 diabetes mellitus with other circulatory complications: Secondary | ICD-10-CM

## 2021-02-04 LAB — POCT GLYCOSYLATED HEMOGLOBIN (HGB A1C): HbA1c, POC (controlled diabetic range): 5.9 % (ref 0.0–7.0)

## 2021-02-04 NOTE — Progress Notes (Signed)
  HPI with pertinent ROS:   CC: DM follow-up  HPI: Pleasant 50 year old female presenting today for follow-up on diabetes.  She was restarted on metformin 500 mg XR daily and switched to Rybelsus 14 mg daily approximately 2 months ago.  She ultimately decided not to start the metformin but has done well on the Rybelsus 14 mg daily.  She has had no noticeable side effects from the new medication.  She has made significant changes to her diet and is working on getting more activity in.  Notes that she has had some episodes where she feels lightheaded and thinks this is orthostatic hypotension.  She does drink at least under 20 ounces of water per day and knows how to handle her low blood pressure episodes.  She was checking her blood sugars regularly but when they got to the point of being in the low 90s in the mornings, she stopped.  I reviewed the past medical history, family history, social history, surgical history, and allergies today and no changes were needed.  Please see the problem list section below in epic for further details.   Physical exam:   General: Well Developed, well nourished, and in no acute distress.  Neuro: Alert and oriented x3.  HEENT: Normocephalic, atraumatic.  Skin: Warm and dry. Cardiac: Regular rate and rhythm, no murmurs rubs or gallops, no lower extremity edema.  Respiratory: Clear to auscultation bilaterally. Not using accessory muscles, speaking in full sentences.  Impression and Recommendations:    1. Type 2 diabetes mellitus without complication, without long-term current use of insulin (HCC) POCT hemoglobin A1c 5.9% today.  Also notable for 15 pound weight loss in the last 2 months.  Significant improvement from her last check.  Continue Rybelsus 14 mg daily.  Continue diabetic diet and work to increase exercise to least 3 times weekly.  2. Hypertension associated with diabetes (HCC) Blood pressure looks great today.  Make sure to stay well-hydrated and  monitor blood pressure if symptoms of orthostasis are present.  3.  Need for shingles vaccine Shingrix No. 2 given in office today.  Return in about 6 months (around 08/04/2021) for DM/HTN/HLD follow up. ___________________________________________ Thayer Ohm, DNP, APRN, FNP-BC Primary Care and Sports Medicine [facility]

## 2021-02-05 ENCOUNTER — Other Ambulatory Visit (HOSPITAL_COMMUNITY): Payer: Self-pay

## 2021-02-05 ENCOUNTER — Other Ambulatory Visit: Payer: Self-pay

## 2021-02-05 LAB — COMPLETE METABOLIC PANEL WITH GFR
AG Ratio: 2 (calc) (ref 1.0–2.5)
ALT: 20 U/L (ref 6–29)
AST: 22 U/L (ref 10–35)
Albumin: 4.7 g/dL (ref 3.6–5.1)
Alkaline phosphatase (APISO): 81 U/L (ref 37–153)
BUN: 17 mg/dL (ref 7–25)
CO2: 21 mmol/L (ref 20–32)
Calcium: 9.9 mg/dL (ref 8.6–10.4)
Chloride: 106 mmol/L (ref 98–110)
Creat: 0.94 mg/dL (ref 0.50–1.03)
Globulin: 2.4 g/dL (calc) (ref 1.9–3.7)
Glucose, Bld: 90 mg/dL (ref 65–99)
Potassium: 4.2 mmol/L (ref 3.5–5.3)
Sodium: 143 mmol/L (ref 135–146)
Total Bilirubin: 0.4 mg/dL (ref 0.2–1.2)
Total Protein: 7.1 g/dL (ref 6.1–8.1)
eGFR: 74 mL/min/{1.73_m2} (ref >=60)

## 2021-02-06 ENCOUNTER — Other Ambulatory Visit (HOSPITAL_COMMUNITY): Payer: Self-pay

## 2021-02-07 ENCOUNTER — Other Ambulatory Visit: Payer: Self-pay | Admitting: Family Medicine

## 2021-02-07 ENCOUNTER — Other Ambulatory Visit (HOSPITAL_COMMUNITY): Payer: Self-pay

## 2021-02-09 ENCOUNTER — Other Ambulatory Visit (HOSPITAL_COMMUNITY): Payer: Self-pay

## 2021-02-09 MED ORDER — CITALOPRAM HYDROBROMIDE 40 MG PO TABS
ORAL_TABLET | Freq: Every day | ORAL | 1 refills | Status: DC
  Filled 2021-02-09: qty 90, 90d supply, fill #0
  Filled 2021-05-03: qty 90, 90d supply, fill #1

## 2021-02-10 ENCOUNTER — Other Ambulatory Visit (HOSPITAL_COMMUNITY): Payer: Self-pay

## 2021-02-27 ENCOUNTER — Other Ambulatory Visit: Payer: Self-pay | Admitting: Medical-Surgical

## 2021-02-27 ENCOUNTER — Other Ambulatory Visit (HOSPITAL_COMMUNITY): Payer: Self-pay

## 2021-02-27 MED ORDER — FLUTICASONE PROPIONATE 50 MCG/ACT NA SUSP
2.0000 | Freq: Every day | NASAL | 0 refills | Status: DC
  Filled 2021-02-27: qty 16, 30d supply, fill #0

## 2021-02-27 MED ORDER — PANTOPRAZOLE SODIUM 40 MG PO TBEC
40.0000 mg | DELAYED_RELEASE_TABLET | Freq: Every day | ORAL | 3 refills | Status: DC
  Filled 2021-02-27: qty 30, 30d supply, fill #0
  Filled 2021-04-04: qty 30, 30d supply, fill #1
  Filled 2021-05-03: qty 30, 30d supply, fill #2
  Filled 2021-06-15: qty 30, 30d supply, fill #3

## 2021-04-04 ENCOUNTER — Other Ambulatory Visit: Payer: Self-pay | Admitting: Medical-Surgical

## 2021-04-04 ENCOUNTER — Other Ambulatory Visit: Payer: Self-pay

## 2021-04-04 ENCOUNTER — Other Ambulatory Visit (HOSPITAL_COMMUNITY): Payer: Self-pay

## 2021-04-06 ENCOUNTER — Other Ambulatory Visit (HOSPITAL_COMMUNITY): Payer: Self-pay

## 2021-04-06 MED ORDER — FLUTICASONE PROPIONATE 50 MCG/ACT NA SUSP
2.0000 | Freq: Every day | NASAL | 0 refills | Status: DC
  Filled 2021-04-06: qty 16, 30d supply, fill #0

## 2021-04-06 MED ORDER — ALBUTEROL SULFATE HFA 108 (90 BASE) MCG/ACT IN AERS
2.0000 | INHALATION_SPRAY | Freq: Four times a day (QID) | RESPIRATORY_TRACT | 0 refills | Status: DC | PRN
  Filled 2021-04-06: qty 54, 75d supply, fill #0

## 2021-04-06 MED ORDER — PREGABALIN 100 MG PO CAPS
ORAL_CAPSULE | Freq: Three times a day (TID) | ORAL | 2 refills | Status: DC
  Filled 2021-04-06: qty 90, 30d supply, fill #0
  Filled 2021-06-15: qty 90, 30d supply, fill #1

## 2021-04-06 NOTE — Telephone Encounter (Signed)
Last OV: 02/04/2021

## 2021-04-13 ENCOUNTER — Other Ambulatory Visit (HOSPITAL_COMMUNITY): Payer: Self-pay

## 2021-04-16 ENCOUNTER — Encounter: Payer: Self-pay | Admitting: Medical-Surgical

## 2021-04-22 ENCOUNTER — Other Ambulatory Visit (HOSPITAL_COMMUNITY): Payer: Self-pay

## 2021-04-22 ENCOUNTER — Encounter: Payer: Self-pay | Admitting: Medical-Surgical

## 2021-04-22 ENCOUNTER — Telehealth: Payer: 59 | Admitting: Medical-Surgical

## 2021-04-22 DIAGNOSIS — F418 Other specified anxiety disorders: Secondary | ICD-10-CM | POA: Diagnosis not present

## 2021-04-22 DIAGNOSIS — J0100 Acute maxillary sinusitis, unspecified: Secondary | ICD-10-CM | POA: Diagnosis not present

## 2021-04-22 MED ORDER — FLUCONAZOLE 150 MG PO TABS
150.0000 mg | ORAL_TABLET | Freq: Once | ORAL | 0 refills | Status: AC
  Filled 2021-04-22: qty 1, 1d supply, fill #0

## 2021-04-22 MED ORDER — AZITHROMYCIN 250 MG PO TABS
ORAL_TABLET | ORAL | 0 refills | Status: AC
  Filled 2021-04-22: qty 6, 5d supply, fill #0

## 2021-04-22 MED ORDER — BUSPIRONE HCL 5 MG PO TABS
5.0000 mg | ORAL_TABLET | Freq: Two times a day (BID) | ORAL | 0 refills | Status: DC
  Filled 2021-04-22: qty 180, 30d supply, fill #0

## 2021-04-22 NOTE — Progress Notes (Signed)
Virtual Visit via Video Note  I connected with Carolyn [name] on 04/22/21 at  2:00 PM EST by a video enabled telemedicine application and verified that I am speaking with the correct person using two identifiers.   I discussed the limitations of evaluation and management by telemedicine and the availability of in person appointments. The patient expressed understanding and agreed to proceed.  Patient location: home Provider locations: office  Subjective:    CC: anxiety/depression  HPI: Very pleasant 51 year old female presenting today for concerns with anxiety and depression.  She has been taking Celexa 40 mg daily as prescribed, tolerating well without side effects.  Her symptoms have been very well managed but unfortunately her best friend passed away.  She is now struggling with processing the loss of her friend who is also a major support system for her.  She reports that she feels like she is alone and has no one to talk to.  She is having periods of crying alternating with anger.  Also having difficulty focusing which is affecting her ability to do her job.  Reports that some days are better than others.  She does have her 3 dogs that are companions.  She is not currently doing any counseling and is not sure if she wants to go that route just yet.  Denies SI/HI.  Notes that she has had sinus congestion with sinus pressure and facial pain for the last week or so.  Thinks this may be a sinus infection and has sent in since she has been crying so much.  No fevers, chills, sore throat, cough, shortness of breath, or chest pain.   Past medical history, Surgical history, Family history not pertinant except as noted below, Social history, Allergies, and medications have been entered into the medical record, reviewed, and corrections made.   Review of Systems: See HPI for pertinent positives and negatives.   Objective:    General: Speaking clearly in complete sentences without any shortness of  breath.  Alert and oriented x3.  Normal judgment. No apparent acute distress.  Impression and Recommendations:    1. Anxiety with depression Continue Celexa 40 mg daily.  Adding BuSpar 5-15 mg twice daily.  Advised that this will work better on a scheduled basis but okay to take on an as-needed basis if desired.  Recommend counseling but she is not quite ready for this and would like to consider it.  She will let me know if she changes her mind for now.  2. Acute non-recurrent maxillary sinusitis Azithromycin 500 mg on day 1 followed by 250 mg daily for days 2 through 4.  Recommend conservative measures at home including Flonase, nasal saline rinses, Tylenol, or ibuprofen.  Due to patient being prone to yeast infections, sending in Diflucan for prophylaxis.  I discussed the assessment and treatment plan with the patient. The patient was provided an opportunity to ask questions and all were answered. The patient agreed with the plan and demonstrated an understanding of the instructions.   The patient was advised to call back or seek an in-person evaluation if the symptoms worsen or if the condition fails to improve as anticipated.  25 minutes of non-face-to-face time was provided during this encounter.  Return in about 6 weeks (around 06/03/2021) for mood follow up.  We will follow-up in 2 weeks via MyChart message to check in and see how she is doing with BuSpar.  Thayer Ohm, DNP, APRN, FNP-BC [facility]

## 2021-04-23 ENCOUNTER — Encounter: Payer: Self-pay | Admitting: Medical-Surgical

## 2021-04-23 DIAGNOSIS — F4320 Adjustment disorder, unspecified: Secondary | ICD-10-CM

## 2021-04-23 DIAGNOSIS — F418 Other specified anxiety disorders: Secondary | ICD-10-CM

## 2021-04-23 DIAGNOSIS — F4321 Adjustment disorder with depressed mood: Secondary | ICD-10-CM

## 2021-05-04 ENCOUNTER — Other Ambulatory Visit (HOSPITAL_COMMUNITY): Payer: Self-pay

## 2021-05-06 ENCOUNTER — Encounter: Payer: Self-pay | Admitting: Medical-Surgical

## 2021-05-22 ENCOUNTER — Encounter: Payer: Self-pay | Admitting: Medical-Surgical

## 2021-05-22 DIAGNOSIS — H2513 Age-related nuclear cataract, bilateral: Secondary | ICD-10-CM | POA: Diagnosis not present

## 2021-05-22 DIAGNOSIS — H25013 Cortical age-related cataract, bilateral: Secondary | ICD-10-CM | POA: Diagnosis not present

## 2021-05-22 DIAGNOSIS — H524 Presbyopia: Secondary | ICD-10-CM | POA: Diagnosis not present

## 2021-05-22 DIAGNOSIS — E119 Type 2 diabetes mellitus without complications: Secondary | ICD-10-CM | POA: Diagnosis not present

## 2021-05-22 DIAGNOSIS — H52223 Regular astigmatism, bilateral: Secondary | ICD-10-CM | POA: Diagnosis not present

## 2021-05-22 DIAGNOSIS — H5213 Myopia, bilateral: Secondary | ICD-10-CM | POA: Diagnosis not present

## 2021-05-22 LAB — HM DIABETES EYE EXAM

## 2021-06-03 ENCOUNTER — Other Ambulatory Visit: Payer: Self-pay | Admitting: Medical-Surgical

## 2021-06-03 ENCOUNTER — Other Ambulatory Visit (HOSPITAL_COMMUNITY): Payer: Self-pay

## 2021-06-04 ENCOUNTER — Other Ambulatory Visit (HOSPITAL_COMMUNITY): Payer: Self-pay

## 2021-06-04 MED ORDER — BUSPIRONE HCL 5 MG PO TABS
5.0000 mg | ORAL_TABLET | Freq: Two times a day (BID) | ORAL | 0 refills | Status: DC
  Filled 2021-06-04: qty 180, 30d supply, fill #0

## 2021-06-15 ENCOUNTER — Other Ambulatory Visit: Payer: Self-pay | Admitting: Medical-Surgical

## 2021-06-15 ENCOUNTER — Other Ambulatory Visit (HOSPITAL_COMMUNITY): Payer: Self-pay

## 2021-06-15 MED ORDER — ALBUTEROL SULFATE HFA 108 (90 BASE) MCG/ACT IN AERS
2.0000 | INHALATION_SPRAY | Freq: Four times a day (QID) | RESPIRATORY_TRACT | 0 refills | Status: DC | PRN
  Filled 2021-06-15: qty 54, 75d supply, fill #0

## 2021-06-15 MED ORDER — FLUTICASONE PROPIONATE 50 MCG/ACT NA SUSP
2.0000 | Freq: Every day | NASAL | 0 refills | Status: DC
  Filled 2021-06-15: qty 16, 30d supply, fill #0

## 2021-07-08 ENCOUNTER — Other Ambulatory Visit: Payer: Self-pay | Admitting: Medical-Surgical

## 2021-07-08 ENCOUNTER — Other Ambulatory Visit (HOSPITAL_COMMUNITY): Payer: Self-pay

## 2021-07-08 MED ORDER — FENOFIBRATE 145 MG PO TABS
ORAL_TABLET | Freq: Every day | ORAL | 1 refills | Status: DC
Start: 2021-07-08 — End: 2021-08-04
  Filled 2021-07-08: qty 90, 90d supply, fill #0

## 2021-07-13 ENCOUNTER — Other Ambulatory Visit (HOSPITAL_COMMUNITY): Payer: Self-pay

## 2021-07-13 ENCOUNTER — Encounter: Payer: Self-pay | Admitting: Medical-Surgical

## 2021-07-13 ENCOUNTER — Telehealth: Payer: Self-pay

## 2021-07-13 NOTE — Telephone Encounter (Signed)
Submitted prior authorization for [employer]  [id number]. ?

## 2021-07-16 ENCOUNTER — Other Ambulatory Visit (HOSPITAL_COMMUNITY): Payer: Self-pay

## 2021-07-17 NOTE — Telephone Encounter (Signed)
The request has been approved. The authorization is effective for a maximum of 12 fills from 07/14/2021 to 07/14/2022, as long as the member is enrolled in their current health plan. The request was approved as submitted. A written notification letter will follow with additional details. ?

## 2021-07-23 ENCOUNTER — Ambulatory Visit: Payer: 59 | Admitting: Sports Medicine

## 2021-07-23 ENCOUNTER — Ambulatory Visit (INDEPENDENT_AMBULATORY_CARE_PROVIDER_SITE_OTHER): Payer: 59

## 2021-07-23 ENCOUNTER — Other Ambulatory Visit (HOSPITAL_COMMUNITY): Payer: Self-pay

## 2021-07-23 ENCOUNTER — Encounter: Payer: Self-pay | Admitting: Sports Medicine

## 2021-07-23 DIAGNOSIS — W19XXXA Unspecified fall, initial encounter: Secondary | ICD-10-CM

## 2021-07-23 DIAGNOSIS — M7022 Olecranon bursitis, left elbow: Secondary | ICD-10-CM

## 2021-07-23 DIAGNOSIS — M7541 Impingement syndrome of right shoulder: Secondary | ICD-10-CM

## 2021-07-23 DIAGNOSIS — M25522 Pain in left elbow: Secondary | ICD-10-CM | POA: Diagnosis not present

## 2021-07-23 DIAGNOSIS — M7989 Other specified soft tissue disorders: Secondary | ICD-10-CM | POA: Diagnosis not present

## 2021-07-23 HISTORY — DX: Impingement syndrome of right shoulder: M75.41

## 2021-07-23 MED ORDER — MELOXICAM 15 MG PO TABS
ORAL_TABLET | ORAL | 3 refills | Status: DC
  Filled 2021-07-23: qty 30, 30d supply, fill #0

## 2021-07-23 NOTE — Assessment & Plan Note (Signed)
Had a fall out of a SUV about 6 weeks ago, describes a large amount of swelling at the tip of the olecranon consistent with a traumatic olecranon bursitis, swelling has improved but she still has some discomfort as well as a palpable subcutaneous rounded movable nodule that I suspect is a remnant of her traumatic olecranon bursitis. ?We will get x-rays, but I have advised that we just watch this for now as it would likely improve over time. ?

## 2021-07-23 NOTE — Assessment & Plan Note (Signed)
Pleasant 51 year old female, she has had increasing pain right shoulder over the deltoid, anteriorly at the joint line, unable to throw a ball. ?On exam she has positive impingement signs, but not much weakness. ?I do think she has some cuff tendinitis as well as potentially a labral injury. ?We discussed the anatomy and pathophysiology, adding x-rays, meloxicam at night she is getting woken up, home conditioning. ?We will do all of the above for 6 weeks. ?MRI +/- injection if not better. ?

## 2021-07-23 NOTE — Progress Notes (Signed)
? ? ?  Procedures performed today:   ? ?None. ? ?Independent interpretation of notes and tests performed by another provider:  ? ?None. ? ?Brief History, Exam, Impression, and Recommendations:   ? ?Impingement syndrome, shoulder, right ?Pleasant 51 year old female, she has had increasing pain right shoulder over the deltoid, anteriorly at the joint line, unable to throw a ball. ?On exam she has positive impingement signs, but not much weakness. ?I do think she has some cuff tendinitis as well as potentially a labral injury. ?We discussed the anatomy and pathophysiology, adding x-rays, meloxicam at night she is getting woken up, home conditioning. ?We will do all of the above for 6 weeks. ?MRI +/- injection if not better. ? ?Olecranon bursitis, left elbow ?Had a fall out of a SUV about 6 weeks ago, describes a large amount of swelling at the tip of the olecranon consistent with a traumatic olecranon bursitis, swelling has improved but she still has some discomfort as well as a palpable subcutaneous rounded movable nodule that I suspect is a remnant of her traumatic olecranon bursitis. ?We will get x-rays, but I have advised that we just watch this for now as it would likely improve over time. ? ?Chronic process with exacerbation and pharmacologic intervention ? ?___________________________________________ ?Gwen Her. Dianah Field, M.D., ABFM., CAQSM. ?Primary Care and Sports Medicine ?[place] ? ?Adjunct Instructor of Family Medicine  ?[employer] ?

## 2021-07-24 ENCOUNTER — Encounter: Payer: Self-pay | Admitting: Sports Medicine

## 2021-08-04 ENCOUNTER — Encounter: Payer: Self-pay | Admitting: Medical-Surgical

## 2021-08-04 ENCOUNTER — Other Ambulatory Visit (HOSPITAL_COMMUNITY): Payer: Self-pay

## 2021-08-04 ENCOUNTER — Ambulatory Visit: Payer: 59 | Admitting: Medical-Surgical

## 2021-08-04 VITALS — BP 125/86 | HR 75 | Resp 20 | Ht 66.5 in | Wt 194.1 lb

## 2021-08-04 DIAGNOSIS — E559 Vitamin D deficiency, unspecified: Secondary | ICD-10-CM | POA: Diagnosis not present

## 2021-08-04 DIAGNOSIS — K219 Gastro-esophageal reflux disease without esophagitis: Secondary | ICD-10-CM

## 2021-08-04 DIAGNOSIS — E785 Hyperlipidemia, unspecified: Secondary | ICD-10-CM | POA: Diagnosis not present

## 2021-08-04 DIAGNOSIS — F418 Other specified anxiety disorders: Secondary | ICD-10-CM

## 2021-08-04 DIAGNOSIS — E119 Type 2 diabetes mellitus without complications: Secondary | ICD-10-CM | POA: Diagnosis not present

## 2021-08-04 DIAGNOSIS — N289 Disorder of kidney and ureter, unspecified: Secondary | ICD-10-CM

## 2021-08-04 DIAGNOSIS — E1159 Type 2 diabetes mellitus with other circulatory complications: Secondary | ICD-10-CM

## 2021-08-04 DIAGNOSIS — I152 Hypertension secondary to endocrine disorders: Secondary | ICD-10-CM | POA: Diagnosis not present

## 2021-08-04 DIAGNOSIS — Z1329 Encounter for screening for other suspected endocrine disorder: Secondary | ICD-10-CM

## 2021-08-04 DIAGNOSIS — E1169 Type 2 diabetes mellitus with other specified complication: Secondary | ICD-10-CM | POA: Diagnosis not present

## 2021-08-04 LAB — POCT GLYCOSYLATED HEMOGLOBIN (HGB A1C): HbA1c POC (<> result, manual entry): 5.5 % (ref 4.0–5.6)

## 2021-08-04 MED ORDER — PREGABALIN 100 MG PO CAPS
ORAL_CAPSULE | Freq: Three times a day (TID) | ORAL | 2 refills | Status: DC
  Filled 2021-08-04: qty 90, 30d supply, fill #0
  Filled 2021-09-09: qty 90, 30d supply, fill #1
  Filled 2021-10-05 – 2021-10-07 (×2): qty 90, 30d supply, fill #2

## 2021-08-04 MED ORDER — FENOFIBRATE 145 MG PO TABS
ORAL_TABLET | Freq: Every day | ORAL | 1 refills | Status: DC
  Filled 2021-08-04: qty 90, fill #0
  Filled 2021-10-05: qty 90, 90d supply, fill #0
  Filled 2021-12-28: qty 90, 90d supply, fill #1

## 2021-08-04 MED ORDER — SEMAGLUTIDE 14 MG PO TABS
1.0000 | ORAL_TABLET | Freq: Every day | ORAL | 11 refills | Status: DC
  Filled 2021-08-04: qty 30, 30d supply, fill #0
  Filled 2021-09-09: qty 30, 30d supply, fill #1
  Filled 2021-10-19: qty 30, 30d supply, fill #2
  Filled 2021-11-17: qty 30, 30d supply, fill #3
  Filled 2021-12-14: qty 30, 30d supply, fill #4
  Filled 2022-01-20: qty 30, 30d supply, fill #5
  Filled 2022-02-14: qty 30, 30d supply, fill #6
  Filled 2022-03-16: qty 30, 30d supply, fill #7
  Filled 2022-04-15: qty 30, 30d supply, fill #8

## 2021-08-04 MED ORDER — CITALOPRAM HYDROBROMIDE 40 MG PO TABS
ORAL_TABLET | Freq: Every day | ORAL | 1 refills | Status: DC
  Filled 2021-08-04: qty 90, 90d supply, fill #0
  Filled 2021-11-01: qty 90, 90d supply, fill #1

## 2021-08-04 MED ORDER — FLUTICASONE PROPIONATE 50 MCG/ACT NA SUSP
2.0000 | Freq: Every day | NASAL | 0 refills | Status: DC
  Filled 2021-08-04: qty 16, 30d supply, fill #0

## 2021-08-04 MED ORDER — BUSPIRONE HCL 5 MG PO TABS
5.0000 mg | ORAL_TABLET | Freq: Two times a day (BID) | ORAL | 0 refills | Status: DC
  Filled 2021-08-04: qty 180, 30d supply, fill #0

## 2021-08-04 NOTE — Progress Notes (Signed)
?  HPI with pertinent ROS:  ? ?CC: 32-month follow-up ? ?HPI: ?Pleasant 51 year old female presenting today for the following: ? ?Hypertension: no medication, gets low at home at times, as low as 90/60. Checks BP when she gets dizziness, Drinks more water and it helps. Denies CP, SOB, palpitations, lower extremity edema, dizziness, headaches, or vision changes. ? ?GERD: using Gaviscon again, helps some. Protonix did not work for very long. Has not tried Pepcid at bedtime.  Has not been eating or drinking foods known to cause worsening reflux prior to bed. ? ?HLD: Taking fenofibrate 145 mg daily, tolerating well without side effects. ? ?Diabetes: Taking semaglutide 14 mg daily, tolerating well without side effects.  Has been trying to follow a diabetic diet however she does have days that are better than others. ? ?Mood: Taking Celexa 40 mg daily.  Previously took BuSpar 15 mg in the morning with 5 mg at night and noticed this was helpful but ran out of medication due to no refills.  Wonders if she should continue taking this medication. ? ?I reviewed the past medical history, family history, social history, surgical history, and allergies today and no changes were needed.  Please see the problem list section below in epic for further details. ? ?Physical exam:  ? ?General: Well Developed, well nourished, and in no acute distress.  ?Neuro: Alert and oriented x3.  ?HEENT: Normocephalic, atraumatic.  ?Skin: Warm and dry. ?Cardiac: Regular rate and rhythm, no murmurs rubs or gallops, no lower extremity edema.  ?Respiratory: Clear to auscultation bilaterally. Not using accessory muscles, speaking in full sentences. ? ?Impression and Recommendations:   ? ?1. Hypertension associated with diabetes ([place]) ?Well-controlled without medication.  Checking labs.  Continue to monitor at home with a goal of less than 130/80.  Recommend low-sodium diet. ? ?2. Gastroesophageal reflux disease without esophagitis ?Okay to use Gaviscon as  needed.  If Protonix did not work, discontinuing today.  Suggest trying famotidine 20 mg twice daily as needed.  May benefit from taking this just before bed.  Elevate head of bed and avoid known reflux triggers for at least 3 hours before bedtime ? ?3. Hyperlipidemia associated with type 2 diabetes mellitus ([place]) ?Continue fenofibrate 145 mg daily.  Checking lipids today. ? ?4. Type 2 diabetes mellitus without complication, without long-term current use of insulin ([place]) ?Hemoglobin A1c drastically improved from 9.8% 9 months ago to 5.5% today.  Continue semaglutide 14 mg daily.  Recommend calling her pharmacy to get this on an automatic refill so she will not forget to call in for her next supply. ? ?5. Anxiety with depression ?Continue Celexa 40mg  daily. Restart Buspar 15mg  twice daily as needed.  ? ?6. Vitamin D deficiency ?Stable at last check. Deferring recheck today since she is having no symptoms.  ? ?Return in about 6 months (around 02/04/2022) for chronic disease follow up. ?___________________________________________ ?Clearnce Sorrel, DNP, APRN, FNP-BC ?Primary Care and Sports Medicine ?[place] ?

## 2021-08-05 LAB — CBC WITH DIFFERENTIAL/PLATELET
Absolute Monocytes: 605 cells/uL (ref 200–950)
Basophils Absolute: 34 cells/uL (ref 0–200)
Basophils Relative: 0.4 %
Eosinophils Absolute: 118 cells/uL (ref 15–500)
Eosinophils Relative: 1.4 %
HCT: 43.9 % (ref 35.0–45.0)
Hemoglobin: 14.4 g/dL (ref 11.7–15.5)
Lymphs Abs: 3646 cells/uL (ref 850–3900)
MCH: 28.4 pg (ref 27.0–33.0)
MCHC: 32.8 g/dL (ref 32.0–36.0)
MCV: 86.6 fL (ref 80.0–100.0)
MPV: 11.3 fL (ref 7.5–12.5)
Monocytes Relative: 7.2 %
Neutro Abs: 3998 cells/uL (ref 1500–7800)
Neutrophils Relative %: 47.6 %
Platelets: 268 10*3/uL (ref 140–400)
RBC: 5.07 10*6/uL (ref 3.80–5.10)
RDW: 12.6 % (ref 11.0–15.0)
Total Lymphocyte: 43.4 %
WBC: 8.4 10*3/uL (ref 3.8–10.8)

## 2021-08-05 LAB — COMPLETE METABOLIC PANEL WITH GFR
AG Ratio: 2 (calc) (ref 1.0–2.5)
ALT: 13 U/L (ref 6–29)
AST: 15 U/L (ref 10–35)
Albumin: 5 g/dL (ref 3.6–5.1)
Alkaline phosphatase (APISO): 66 U/L (ref 37–153)
BUN/Creatinine Ratio: 20 (calc) (ref 6–22)
BUN: 26 mg/dL — ABNORMAL HIGH (ref 7–25)
CO2: 26 mmol/L (ref 20–32)
Calcium: 9.9 mg/dL (ref 8.6–10.4)
Chloride: 107 mmol/L (ref 98–110)
Creat: 1.29 mg/dL — ABNORMAL HIGH (ref 0.50–1.03)
Globulin: 2.5 g/dL (calc) (ref 1.9–3.7)
Glucose, Bld: 94 mg/dL (ref 65–99)
Potassium: 4.6 mmol/L (ref 3.5–5.3)
Sodium: 142 mmol/L (ref 135–146)
Total Bilirubin: 0.4 mg/dL (ref 0.2–1.2)
Total Protein: 7.5 g/dL (ref 6.1–8.1)
eGFR: 50 mL/min/{1.73_m2} — ABNORMAL LOW (ref >=60)

## 2021-08-05 LAB — LIPID PANEL
Cholesterol: 189 mg/dL (ref <200)
HDL: 60 mg/dL (ref >=50)
LDL Cholesterol (Calc): 108 mg/dL (calc) — ABNORMAL HIGH
Non-HDL Cholesterol (Calc): 129 mg/dL (calc) (ref <130)
Total CHOL/HDL Ratio: 3.2 (calc) (ref <5.0)
Triglycerides: 118 mg/dL (ref <150)

## 2021-08-05 LAB — VITAMIN D 25 HYDROXY (VIT D DEFICIENCY, FRACTURES): Vit D, 25-Hydroxy: 64 ng/mL (ref 30–100)

## 2021-08-05 LAB — TSH: TSH: 1.76 mIU/L

## 2021-08-05 NOTE — Addendum Note (Signed)
Addended byChristen Butter on: 08/05/2021 07:25 AM ? ? Modules accepted: Orders ? ?

## 2021-08-06 ENCOUNTER — Other Ambulatory Visit: Payer: Self-pay

## 2021-08-06 DIAGNOSIS — N289 Disorder of kidney and ureter, unspecified: Secondary | ICD-10-CM

## 2021-08-06 NOTE — Progress Notes (Signed)
Ordered labs per result notes.  °

## 2021-08-10 ENCOUNTER — Other Ambulatory Visit (HOSPITAL_COMMUNITY): Payer: Self-pay

## 2021-09-03 ENCOUNTER — Ambulatory Visit: Payer: 59 | Admitting: Sports Medicine

## 2021-09-03 ENCOUNTER — Ambulatory Visit (INDEPENDENT_AMBULATORY_CARE_PROVIDER_SITE_OTHER): Payer: 59

## 2021-09-03 DIAGNOSIS — M7541 Impingement syndrome of right shoulder: Secondary | ICD-10-CM

## 2021-09-03 DIAGNOSIS — M7022 Olecranon bursitis, left elbow: Secondary | ICD-10-CM

## 2021-09-03 DIAGNOSIS — N289 Disorder of kidney and ureter, unspecified: Secondary | ICD-10-CM | POA: Diagnosis not present

## 2021-09-03 NOTE — Progress Notes (Signed)
    Procedures performed today:    Procedure: Real-time Ultrasound Guided injection of the right subacromial bursa Device: Samsung HS60  Verbal informed consent obtained.  Time-out conducted.  Noted no overlying erythema, induration, or other signs of local infection.  Skin prepped in a sterile fashion.  Local anesthesia: Topical Ethyl chloride.  With sterile technique and under real time ultrasound guidance: Intact cuff noted, 1 cc Kenalog 40, 1 cc lidocaine, 1 cc bupivacaine injected easily Completed without difficulty  Advised to call if fevers/chills, erythema, induration, drainage, or persistent bleeding.  Images permanently stored and available for review in PACS.  Impression: Technically successful ultrasound guided injection.  Independent interpretation of notes and tests performed by another provider:   None.  Brief History, Exam, Impression, and Recommendations:    Impingement syndrome, shoulder, right Very pleasant 51 year old female, chronic right shoulder pain, had some shoulder pain prior to a fall, then 12 weeks ago had a fall out of a truck. Ultimately x-ray showed what appeared to be an avulsion from the greater tuberosity. Improved considerably with conservative treatment but still has pain over the deltoid and difficulty throwing. Mild impingement signs on exam today but good strength. Today we did a subacromial injection, continue home conditioning, return to see me in 6 weeks as needed.  Olecranon bursitis, left elbow Resolved.    ___________________________________________ Ihor Austin. Benjamin Stain, M.D., ABFM., CAQSM. Primary Care and Sports Medicine [facility]  Adjunct Instructor of Family Medicine  [employer]

## 2021-09-03 NOTE — Assessment & Plan Note (Signed)
Resolved

## 2021-09-03 NOTE — Assessment & Plan Note (Signed)
Very pleasant 51 year old female, chronic right shoulder pain, had some shoulder pain prior to a fall, then 12 weeks ago had a fall out of a truck. Ultimately x-ray showed what appeared to be an avulsion from the greater tuberosity. Improved considerably with conservative treatment but still has pain over the deltoid and difficulty throwing. Mild impingement signs on exam today but good strength. Today we did a subacromial injection, continue home conditioning, return to see me in 6 weeks as needed.

## 2021-09-04 LAB — BASIC METABOLIC PANEL WITH GFR
BUN: 18 mg/dL (ref 7–25)
CO2: 25 mmol/L (ref 20–32)
Calcium: 9.5 mg/dL (ref 8.6–10.4)
Chloride: 109 mmol/L (ref 98–110)
Creat: 0.99 mg/dL (ref 0.50–1.03)
Glucose, Bld: 104 mg/dL — ABNORMAL HIGH (ref 65–99)
Potassium: 4.4 mmol/L (ref 3.5–5.3)
Sodium: 144 mmol/L (ref 135–146)
eGFR: 69 mL/min/{1.73_m2} (ref >=60)

## 2021-09-09 ENCOUNTER — Other Ambulatory Visit: Payer: Self-pay | Admitting: Medical-Surgical

## 2021-09-09 ENCOUNTER — Other Ambulatory Visit (HOSPITAL_COMMUNITY): Payer: Self-pay

## 2021-09-10 ENCOUNTER — Other Ambulatory Visit (HOSPITAL_COMMUNITY): Payer: Self-pay

## 2021-09-10 MED ORDER — FLUTICASONE PROPIONATE 50 MCG/ACT NA SUSP
2.0000 | Freq: Every day | NASAL | 0 refills | Status: DC
  Filled 2021-09-10: qty 16, 30d supply, fill #0

## 2021-09-17 ENCOUNTER — Encounter: Payer: Self-pay | Admitting: Medical-Surgical

## 2021-09-23 ENCOUNTER — Other Ambulatory Visit (HOSPITAL_COMMUNITY): Payer: Self-pay

## 2021-10-05 ENCOUNTER — Other Ambulatory Visit (HOSPITAL_COMMUNITY): Payer: Self-pay

## 2021-10-05 ENCOUNTER — Other Ambulatory Visit: Payer: Self-pay | Admitting: Medical-Surgical

## 2021-10-07 ENCOUNTER — Other Ambulatory Visit (HOSPITAL_COMMUNITY): Payer: Self-pay

## 2021-10-07 MED ORDER — ALBUTEROL SULFATE HFA 108 (90 BASE) MCG/ACT IN AERS
2.0000 | INHALATION_SPRAY | Freq: Four times a day (QID) | RESPIRATORY_TRACT | 0 refills | Status: DC | PRN
  Filled 2021-10-07: qty 54, 75d supply, fill #0

## 2021-10-07 MED ORDER — BUSPIRONE HCL 5 MG PO TABS
5.0000 mg | ORAL_TABLET | Freq: Two times a day (BID) | ORAL | 0 refills | Status: DC
  Filled 2021-10-07: qty 145, 24d supply, fill #0
  Filled 2021-10-08: qty 35, 6d supply, fill #0

## 2021-10-07 MED ORDER — FLUTICASONE PROPIONATE 50 MCG/ACT NA SUSP
2.0000 | Freq: Every day | NASAL | 0 refills | Status: DC
  Filled 2021-10-07: qty 16, 30d supply, fill #0

## 2021-10-08 ENCOUNTER — Other Ambulatory Visit (HOSPITAL_COMMUNITY): Payer: Self-pay

## 2021-10-15 ENCOUNTER — Ambulatory Visit: Payer: 59 | Admitting: Sports Medicine

## 2021-10-19 ENCOUNTER — Other Ambulatory Visit (HOSPITAL_COMMUNITY): Payer: Self-pay

## 2021-10-20 ENCOUNTER — Other Ambulatory Visit (HOSPITAL_COMMUNITY): Payer: Self-pay

## 2021-11-01 ENCOUNTER — Other Ambulatory Visit (HOSPITAL_COMMUNITY): Payer: Self-pay

## 2021-11-01 ENCOUNTER — Other Ambulatory Visit: Payer: Self-pay | Admitting: Medical-Surgical

## 2021-11-02 ENCOUNTER — Other Ambulatory Visit (HOSPITAL_COMMUNITY): Payer: Self-pay

## 2021-11-09 ENCOUNTER — Other Ambulatory Visit (HOSPITAL_COMMUNITY): Payer: Self-pay

## 2021-11-09 NOTE — Progress Notes (Unsigned)
Complete physical exam  Patient: Carolyn [name]   DOB: [birth year]   51 y.o. Female  MRN: [id number]  Subjective:    No chief complaint on file.   Carolyn [name] is a 51 y.o. female who presents today for a complete physical exam. She reports consuming a {diet types:17450} diet. {types:19826} She generally feels {DESC; WELL/FAIRLY WELL/POORLY:18703}. She reports sleeping {DESC; WELL/FAIRLY WELL/POORLY:18703}. She {does/does not:200015} have additional problems to discuss today.    Most recent fall risk assessment:    08/04/2021    8:51 AM  Fall Risk   Falls in the past year? 1  Number falls in past yr: 0  Injury with Fall? 1  Risk for fall due to : History of fall(s)  Follow up Falls evaluation completed     Most recent depression screenings:    08/04/2021    8:52 AM 02/04/2021    8:23 AM  PHQ 2/9 Scores  PHQ - 2 Score 2 2  PHQ- 9 Score 7 5    {VISON DENTAL STD PSA (Optional):27386}  {History (Optional):23778}  Patient Care Team: Christen Butter, NP as PCP - General (Nurse Practitioner)   Outpatient Medications Prior to Visit  Medication Sig   albuterol (VENTOLIN HFA) 108 (90 Base) MCG/ACT inhaler Inhale 2 puffs into the lungs every 6 hours as needed for wheezing or shortness of breath.   busPIRone (BUSPAR) 5 MG tablet Take 1 to 3 tablets (5-15 mg total) by mouth 2 times daily.   citalopram (CELEXA) 40 MG tablet TAKE 1 TABLET BY MOUTH ONCE A DAY   fenofibrate (TRICOR) 145 MG tablet TAKE 1 TABLET BY MOUTH ONCE A DAY   fluticasone (FLONASE) 50 MCG/ACT nasal spray Place 2 sprays into both nostrils daily.   Lancets MISC by Does not apply route.   pregabalin (LYRICA) 100 MG capsule TAKE 1 CAPSULE BY MOUTH 3 TIMES DAILY   Semaglutide 14 MG TABS Take 1 tablet by mouth daily.   [DISCONTINUED] meloxicam (MOBIC) 15 MG tablet Take 1 tablet by mouth every 24 hours with dinner for 2 weeks, then take 1 tablet once every 24 hours if needed for pain.   No facility-administered medications  prior to visit.    ROS    Objective:     There were no vitals taken for this visit. {Vitals History (Optional):23777}  Physical Exam   No results found for any visits on 11/10/21. {Show previous labs (optional):23779}    Assessment & Plan:    Routine Health Maintenance and Physical Exam  Immunization History  Administered Date(s) Administered   Influenza Split 12/28/2011, 12/30/2014, 01/05/2016, 01/10/2017, 12/30/2017, 01/15/2019   Influenza,inj,Quad PF,6+ Mos 12/02/2020   PFIZER(Purple Top)SARS-COV-2 Vaccination 03/12/2019, 03/21/2019, 04/11/2019   Pneumococcal Conjugate-13 02/10/2011   Pneumococcal Polysaccharide-23 02/10/2011   Tdap 06/01/2007   Zoster Recombinat (Shingrix) 10/31/2020, 02/04/2021    Health Maintenance  Topic Date Due   OPHTHALMOLOGY EXAM  Never done   COVID-19 Vaccine (4 - Pfizer series) 06/06/2019   MAMMOGRAM  Never done   Diabetic kidney evaluation - Urine ACR  04/09/2021   INFLUENZA VACCINE  10/27/2021   TETANUS/TDAP  02/04/2022 (Originally 05/31/2017)   COLONOSCOPY (Pts 45-34yrs Insurance coverage will need to be confirmed)  04/22/2022 (Originally 04/03/2015)   FOOT EXAM  02/04/2022   HEMOGLOBIN A1C  02/04/2022   Diabetic kidney evaluation - GFR measurement  09/04/2022   Zoster Vaccines- Shingrix  Completed   HPV VACCINES  Aged Out   PAP SMEAR-Modifier  Discontinued   Hepatitis C Screening  Discontinued   HIV Screening  Discontinued    Discussed health benefits of physical activity, and encouraged her to engage in regular exercise appropriate for her age and condition.  Problem List Items Addressed This Visit       Cardiovascular and Mediastinum   Hypertension associated with diabetes (HCC)     Endocrine   Hyperlipidemia associated with type 2 diabetes mellitus (HCC)   Type 2 diabetes mellitus without complication, without long-term current use of insulin (HCC)     Other   Anxiety with depression   Vitamin D deficiency   Other  Visit Diagnoses     Annual physical exam    -  Primary      No follow-ups on file.   Christen Butter, NP

## 2021-11-10 ENCOUNTER — Encounter: Payer: Self-pay | Admitting: Medical-Surgical

## 2021-11-10 ENCOUNTER — Other Ambulatory Visit: Payer: Self-pay | Admitting: Medical-Surgical

## 2021-11-10 ENCOUNTER — Ambulatory Visit (INDEPENDENT_AMBULATORY_CARE_PROVIDER_SITE_OTHER): Payer: 59 | Admitting: Medical-Surgical

## 2021-11-10 VITALS — BP 136/80 | HR 69 | Resp 20 | Ht 66.5 in | Wt 208.4 lb

## 2021-11-10 DIAGNOSIS — E785 Hyperlipidemia, unspecified: Secondary | ICD-10-CM

## 2021-11-10 DIAGNOSIS — E1159 Type 2 diabetes mellitus with other circulatory complications: Secondary | ICD-10-CM

## 2021-11-10 DIAGNOSIS — F418 Other specified anxiety disorders: Secondary | ICD-10-CM | POA: Diagnosis not present

## 2021-11-10 DIAGNOSIS — E559 Vitamin D deficiency, unspecified: Secondary | ICD-10-CM

## 2021-11-10 DIAGNOSIS — I152 Hypertension secondary to endocrine disorders: Secondary | ICD-10-CM

## 2021-11-10 DIAGNOSIS — E119 Type 2 diabetes mellitus without complications: Secondary | ICD-10-CM | POA: Diagnosis not present

## 2021-11-10 DIAGNOSIS — E1169 Type 2 diabetes mellitus with other specified complication: Secondary | ICD-10-CM | POA: Diagnosis not present

## 2021-11-10 DIAGNOSIS — Z0000 Encounter for general adult medical examination without abnormal findings: Secondary | ICD-10-CM

## 2021-11-10 LAB — POCT UA - MICROALBUMIN
Albumin/Creatinine Ratio, Urine, POC: <30
Creatinine, POC: 300 mg/dL
Microalbumin Ur, POC: 10 mg/L

## 2021-11-11 ENCOUNTER — Encounter: Payer: Self-pay | Admitting: Medical-Surgical

## 2021-11-11 ENCOUNTER — Other Ambulatory Visit (HOSPITAL_COMMUNITY): Payer: Self-pay

## 2021-11-11 LAB — CBC WITH DIFFERENTIAL/PLATELET
Absolute Monocytes: 631 cells/uL (ref 200–950)
Basophils Absolute: 33 cells/uL (ref 0–200)
Basophils Relative: 0.4 %
Eosinophils Absolute: 149 cells/uL (ref 15–500)
Eosinophils Relative: 1.8 %
HCT: 41.6 % (ref 35.0–45.0)
Hemoglobin: 14.1 g/dL (ref 11.7–15.5)
Lymphs Abs: 3835 cells/uL (ref 850–3900)
MCH: 28.7 pg (ref 27.0–33.0)
MCHC: 33.9 g/dL (ref 32.0–36.0)
MCV: 84.6 fL (ref 80.0–100.0)
MPV: 11.1 fL (ref 7.5–12.5)
Monocytes Relative: 7.6 %
Neutro Abs: 3652 cells/uL (ref 1500–7800)
Neutrophils Relative %: 44 %
Platelets: 285 10*3/uL (ref 140–400)
RBC: 4.92 10*6/uL (ref 3.80–5.10)
RDW: 12.7 % (ref 11.0–15.0)
Total Lymphocyte: 46.2 %
WBC: 8.3 10*3/uL (ref 3.8–10.8)

## 2021-11-11 LAB — COMPLETE METABOLIC PANEL WITH GFR
AG Ratio: 1.9 (calc) (ref 1.0–2.5)
ALT: 19 U/L (ref 6–29)
AST: 20 U/L (ref 10–35)
Albumin: 4.8 g/dL (ref 3.6–5.1)
Alkaline phosphatase (APISO): 66 U/L (ref 37–153)
BUN/Creatinine Ratio: 13 (calc) (ref 6–22)
BUN: 17 mg/dL (ref 7–25)
CO2: 26 mmol/L (ref 20–32)
Calcium: 9.8 mg/dL (ref 8.6–10.4)
Chloride: 106 mmol/L (ref 98–110)
Creat: 1.26 mg/dL — ABNORMAL HIGH (ref 0.50–1.03)
Globulin: 2.5 g/dL (calc) (ref 1.9–3.7)
Glucose, Bld: 102 mg/dL — ABNORMAL HIGH (ref 65–99)
Potassium: 4.6 mmol/L (ref 3.5–5.3)
Sodium: 142 mmol/L (ref 135–146)
Total Bilirubin: 0.5 mg/dL (ref 0.2–1.2)
Total Protein: 7.3 g/dL (ref 6.1–8.1)
eGFR: 52 mL/min/{1.73_m2} — ABNORMAL LOW (ref >=60)

## 2021-11-11 LAB — LIPID PANEL
Cholesterol: 198 mg/dL (ref <200)
HDL: 63 mg/dL (ref >=50)
LDL Cholesterol (Calc): 112 mg/dL (calc) — ABNORMAL HIGH
Non-HDL Cholesterol (Calc): 135 mg/dL (calc) — ABNORMAL HIGH (ref <130)
Total CHOL/HDL Ratio: 3.1 (calc) (ref <5.0)
Triglycerides: 123 mg/dL (ref <150)

## 2021-11-11 MED ORDER — FLUTICASONE PROPIONATE 50 MCG/ACT NA SUSP
2.0000 | Freq: Every day | NASAL | 0 refills | Status: DC
  Filled 2021-11-11: qty 16, 30d supply, fill #0

## 2021-11-17 ENCOUNTER — Other Ambulatory Visit: Payer: Self-pay | Admitting: Medical-Surgical

## 2021-11-17 ENCOUNTER — Other Ambulatory Visit (HOSPITAL_COMMUNITY): Payer: Self-pay

## 2021-11-20 ENCOUNTER — Other Ambulatory Visit (HOSPITAL_COMMUNITY): Payer: Self-pay

## 2021-11-23 ENCOUNTER — Other Ambulatory Visit: Payer: Self-pay | Admitting: Medical-Surgical

## 2021-11-23 DIAGNOSIS — Z1231 Encounter for screening mammogram for malignant neoplasm of breast: Secondary | ICD-10-CM

## 2021-11-26 DIAGNOSIS — Z1231 Encounter for screening mammogram for malignant neoplasm of breast: Secondary | ICD-10-CM

## 2021-12-04 ENCOUNTER — Other Ambulatory Visit: Payer: Self-pay | Admitting: Medical-Surgical

## 2021-12-08 ENCOUNTER — Other Ambulatory Visit (HOSPITAL_COMMUNITY): Payer: Self-pay

## 2021-12-08 MED ORDER — FLUTICASONE PROPIONATE 50 MCG/ACT NA SUSP
2.0000 | Freq: Every day | NASAL | 0 refills | Status: DC
  Filled 2021-12-08: qty 16, 30d supply, fill #0

## 2021-12-08 MED ORDER — PREGABALIN 100 MG PO CAPS
ORAL_CAPSULE | Freq: Three times a day (TID) | ORAL | 2 refills | Status: DC
  Filled 2021-12-08: qty 90, 30d supply, fill #0
  Filled 2022-01-03: qty 90, 30d supply, fill #1
  Filled 2022-01-31: qty 90, 30d supply, fill #2

## 2021-12-09 ENCOUNTER — Other Ambulatory Visit (HOSPITAL_COMMUNITY): Payer: Self-pay

## 2021-12-14 ENCOUNTER — Other Ambulatory Visit (HOSPITAL_COMMUNITY): Payer: Self-pay

## 2021-12-14 ENCOUNTER — Other Ambulatory Visit: Payer: Self-pay | Admitting: Medical-Surgical

## 2021-12-14 MED ORDER — BUSPIRONE HCL 5 MG PO TABS
5.0000 mg | ORAL_TABLET | Freq: Two times a day (BID) | ORAL | 0 refills | Status: DC
Start: 2021-12-14 — End: 2022-02-09
  Filled 2021-12-14: qty 180, 30d supply, fill #0

## 2021-12-18 ENCOUNTER — Ambulatory Visit (INDEPENDENT_AMBULATORY_CARE_PROVIDER_SITE_OTHER): Payer: 59 | Admitting: Medical-Surgical

## 2021-12-18 DIAGNOSIS — Z23 Encounter for immunization: Secondary | ICD-10-CM

## 2021-12-28 ENCOUNTER — Other Ambulatory Visit (HOSPITAL_COMMUNITY): Payer: Self-pay

## 2021-12-28 ENCOUNTER — Encounter: Payer: Self-pay | Admitting: Medical-Surgical

## 2022-01-03 ENCOUNTER — Other Ambulatory Visit: Payer: Self-pay | Admitting: Medical-Surgical

## 2022-01-04 ENCOUNTER — Other Ambulatory Visit (HOSPITAL_COMMUNITY): Payer: Self-pay

## 2022-01-04 MED ORDER — FLUTICASONE PROPIONATE 50 MCG/ACT NA SUSP
2.0000 | Freq: Every day | NASAL | 0 refills | Status: DC
  Filled 2022-01-04: qty 16, 30d supply, fill #0

## 2022-01-04 MED ORDER — ALBUTEROL SULFATE HFA 108 (90 BASE) MCG/ACT IN AERS
2.0000 | INHALATION_SPRAY | Freq: Four times a day (QID) | RESPIRATORY_TRACT | 0 refills | Status: DC | PRN
  Filled 2022-01-04: qty 20.1, 75d supply, fill #0

## 2022-01-04 MED ORDER — CITALOPRAM HYDROBROMIDE 40 MG PO TABS
40.0000 mg | ORAL_TABLET | Freq: Every day | ORAL | 1 refills | Status: DC
  Filled 2022-01-04 – 2022-01-20 (×2): qty 90, 90d supply, fill #0
  Filled 2022-04-15: qty 90, 90d supply, fill #1

## 2022-01-05 ENCOUNTER — Other Ambulatory Visit (HOSPITAL_COMMUNITY): Payer: Self-pay

## 2022-01-20 ENCOUNTER — Other Ambulatory Visit (HOSPITAL_COMMUNITY): Payer: Self-pay

## 2022-01-31 ENCOUNTER — Encounter: Payer: Self-pay | Admitting: Sports Medicine

## 2022-02-01 ENCOUNTER — Other Ambulatory Visit (HOSPITAL_COMMUNITY): Payer: Self-pay

## 2022-02-04 ENCOUNTER — Ambulatory Visit (INDEPENDENT_AMBULATORY_CARE_PROVIDER_SITE_OTHER): Payer: 59

## 2022-02-04 DIAGNOSIS — Z1231 Encounter for screening mammogram for malignant neoplasm of breast: Secondary | ICD-10-CM

## 2022-02-08 ENCOUNTER — Other Ambulatory Visit (HOSPITAL_COMMUNITY): Payer: Self-pay

## 2022-02-08 ENCOUNTER — Other Ambulatory Visit: Payer: Self-pay | Admitting: Medical-Surgical

## 2022-02-08 MED ORDER — FLUTICASONE PROPIONATE 50 MCG/ACT NA SUSP
2.0000 | Freq: Every day | NASAL | 0 refills | Status: DC
  Filled 2022-02-08: qty 16, 30d supply, fill #0

## 2022-02-08 NOTE — Progress Notes (Unsigned)
   Established Patient Office Visit  Subjective   Patient ID: Carolyn [name], female   DOB: [birth year] Age: 51 y.o. MRN: [id number]   No chief complaint on file.   HPI    Objective:    There were no vitals filed for this visit.  Physical Exam   No results found for this or any previous visit (from the past 24 hour(s)).   {Labs (Optional):23779}  The 10-year ASCVD risk score (Arnett DK, et al., 2019) is: 2.5%   Values used to calculate the score:     Age: 3 years     Sex: Female     Is Non-Hispanic African American: No     Diabetic: Yes     Tobacco smoker: No     Systolic Blood Pressure: 136 mmHg     Is BP treated: No     HDL Cholesterol: 63 mg/dL     Total Cholesterol: 198 mg/dL   Assessment & Plan:   No problem-specific Assessment & Plan notes found for this encounter.   No follow-ups on file.  ___________________________________________ Thayer Ohm, DNP, APRN, FNP-BC Primary Care and Sports Medicine [facility]

## 2022-02-09 ENCOUNTER — Other Ambulatory Visit (HOSPITAL_COMMUNITY): Payer: Self-pay

## 2022-02-09 ENCOUNTER — Encounter: Payer: Self-pay | Admitting: Medical-Surgical

## 2022-02-09 ENCOUNTER — Ambulatory Visit: Payer: 59 | Admitting: Medical-Surgical

## 2022-02-09 ENCOUNTER — Other Ambulatory Visit: Payer: Self-pay | Admitting: Medical-Surgical

## 2022-02-09 VITALS — BP 107/72 | HR 83 | Resp 20 | Ht 66.5 in | Wt 210.1 lb

## 2022-02-09 DIAGNOSIS — E785 Hyperlipidemia, unspecified: Secondary | ICD-10-CM

## 2022-02-09 DIAGNOSIS — E119 Type 2 diabetes mellitus without complications: Secondary | ICD-10-CM

## 2022-02-09 DIAGNOSIS — J0100 Acute maxillary sinusitis, unspecified: Secondary | ICD-10-CM | POA: Diagnosis not present

## 2022-02-09 DIAGNOSIS — F418 Other specified anxiety disorders: Secondary | ICD-10-CM

## 2022-02-09 DIAGNOSIS — I152 Hypertension secondary to endocrine disorders: Secondary | ICD-10-CM

## 2022-02-09 DIAGNOSIS — K219 Gastro-esophageal reflux disease without esophagitis: Secondary | ICD-10-CM

## 2022-02-09 DIAGNOSIS — E1169 Type 2 diabetes mellitus with other specified complication: Secondary | ICD-10-CM | POA: Diagnosis not present

## 2022-02-09 DIAGNOSIS — E559 Vitamin D deficiency, unspecified: Secondary | ICD-10-CM

## 2022-02-09 DIAGNOSIS — E1159 Type 2 diabetes mellitus with other circulatory complications: Secondary | ICD-10-CM

## 2022-02-09 LAB — POCT GLYCOSYLATED HEMOGLOBIN (HGB A1C): HbA1c, POC (controlled diabetic range): 6.7 % (ref 0.0–7.0)

## 2022-02-09 MED ORDER — AMOXICILLIN-POT CLAVULANATE 875-125 MG PO TABS
1.0000 | ORAL_TABLET | Freq: Two times a day (BID) | ORAL | 0 refills | Status: DC
  Filled 2022-02-09: qty 14, 7d supply, fill #0

## 2022-02-09 MED ORDER — LANSOPRAZOLE 30 MG PO CPDR
30.0000 mg | DELAYED_RELEASE_CAPSULE | Freq: Every day | ORAL | 1 refills | Status: DC
  Filled 2022-02-09: qty 90, 90d supply, fill #0
  Filled 2022-05-06: qty 90, 90d supply, fill #1

## 2022-02-09 MED ORDER — BUSPIRONE HCL 15 MG PO TABS
15.0000 mg | ORAL_TABLET | Freq: Two times a day (BID) | ORAL | 1 refills | Status: DC
  Filled 2022-02-09: qty 180, 90d supply, fill #0
  Filled 2022-06-01: qty 180, 90d supply, fill #1

## 2022-02-09 MED ORDER — FLUCONAZOLE 150 MG PO TABS
150.0000 mg | ORAL_TABLET | Freq: Once | ORAL | 0 refills | Status: AC
  Filled 2022-02-09: qty 2, 2d supply, fill #0

## 2022-02-10 ENCOUNTER — Other Ambulatory Visit (HOSPITAL_COMMUNITY): Payer: Self-pay

## 2022-02-14 ENCOUNTER — Other Ambulatory Visit (HOSPITAL_COMMUNITY): Payer: Self-pay

## 2022-02-15 ENCOUNTER — Other Ambulatory Visit (HOSPITAL_COMMUNITY): Payer: Self-pay

## 2022-02-15 ENCOUNTER — Ambulatory Visit: Payer: 59 | Admitting: Sports Medicine

## 2022-02-19 ENCOUNTER — Encounter: Payer: Self-pay | Admitting: Medical-Surgical

## 2022-02-22 ENCOUNTER — Other Ambulatory Visit: Payer: Self-pay | Admitting: Medical-Surgical

## 2022-02-22 ENCOUNTER — Encounter: Payer: Self-pay | Admitting: Medical-Surgical

## 2022-02-22 MED ORDER — PREDNISONE 50 MG PO TABS: 50.0000 mg | ORAL_TABLET | Freq: Every day | ORAL | 0 refills | Status: DC

## 2022-02-22 MED ORDER — AZITHROMYCIN 250 MG PO TABS: ORAL_TABLET | ORAL | 0 refills | Status: AC

## 2022-02-26 ENCOUNTER — Ambulatory Visit: Payer: 59 | Admitting: Sports Medicine

## 2022-02-26 ENCOUNTER — Ambulatory Visit (INDEPENDENT_AMBULATORY_CARE_PROVIDER_SITE_OTHER): Payer: 59

## 2022-02-26 DIAGNOSIS — M7541 Impingement syndrome of right shoulder: Secondary | ICD-10-CM

## 2022-02-26 MED ORDER — TRIAMCINOLONE ACETONIDE 40 MG/ML IJ SUSP
40.0000 mg | Freq: Once | INTRAMUSCULAR | Status: AC
  Administered 2022-02-26: 40 mg via INTRAMUSCULAR

## 2022-02-26 NOTE — Addendum Note (Signed)
Addended by: Carren Rang A on: 02/26/2022 04:33 PM   Modules accepted: Orders

## 2022-02-26 NOTE — Progress Notes (Signed)
    Procedures performed today:    Procedure: Real-time Ultrasound Guided injection of the right subacromial bursa Device: Samsung HS60  Verbal informed consent obtained.  Time-out conducted.  Noted no overlying erythema, induration, or other signs of local infection.  Skin prepped in a sterile fashion.  Local anesthesia: Topical Ethyl chloride.  With sterile technique and under real time ultrasound guidance: Intact cuff noted, 1 cc Kenalog 40, 1 cc lidocaine, 1 cc bupivacaine injected easily Completed without difficulty  Advised to call if fevers/chills, erythema, induration, drainage, or persistent bleeding.  Images permanently stored and available for review in PACS.  Impression: Technically successful ultrasound guided injection.  Independent interpretation of notes and tests performed by another provider:   None.  Brief History, Exam, Impression, and Recommendations:    Impingement syndrome, shoulder, right This is a very pleasant 51 year old female, chronic right shoulder pain, we injected her subacromial bursa back in June, and she did extremely well, she was doing well until trying to chop some Suder recently, now has a recurrence of pain localized over the deltoid, worse with abduction. We repeated the subacromial injection today per her request, and she understands that she needs to avoid overhead activities for now, and get back on the home conditioning, return as needed. She plans to use an automatic log splitter from now on.    ____________________________________________ Ihor Austin. Benjamin Stain, M.D., ABFM., CAQSM., AME. Primary Care and Sports Medicine  [facility]  Adjunct Professor of Family Medicine  [employer]  Restaurant manager, fast food

## 2022-02-26 NOTE — Assessment & Plan Note (Addendum)
This is a very pleasant 51 year old female, chronic right shoulder pain, we injected her subacromial bursa back in June, and she did extremely well, she was doing well until trying to chop some Monger recently, now has a recurrence of pain localized over the deltoid, worse with abduction. We repeated the subacromial injection today per her request, and she understands that she needs to avoid overhead activities for now, and get back on the home conditioning, return as needed. She plans to use an automatic log splitter from now on.

## 2022-03-04 ENCOUNTER — Other Ambulatory Visit (HOSPITAL_COMMUNITY): Payer: Self-pay

## 2022-03-04 ENCOUNTER — Other Ambulatory Visit: Payer: Self-pay | Admitting: Medical-Surgical

## 2022-03-04 MED ORDER — PREGABALIN 100 MG PO CAPS
ORAL_CAPSULE | Freq: Three times a day (TID) | ORAL | 1 refills | Status: DC
  Filled 2022-03-04 – 2022-03-06 (×2): qty 270, 90d supply, fill #0
  Filled 2022-06-01 – 2022-07-08 (×2): qty 270, 90d supply, fill #1

## 2022-03-04 MED ORDER — FLUTICASONE PROPIONATE 50 MCG/ACT NA SUSP
2.0000 | Freq: Every day | NASAL | 3 refills | Status: DC
  Filled 2022-03-04: qty 16, 30d supply, fill #0
  Filled 2022-04-15: qty 16, 30d supply, fill #1
  Filled 2022-06-01: qty 16, 30d supply, fill #2
  Filled 2022-07-08: qty 16, 30d supply, fill #3

## 2022-03-05 ENCOUNTER — Other Ambulatory Visit (HOSPITAL_COMMUNITY): Payer: Self-pay

## 2022-03-06 ENCOUNTER — Other Ambulatory Visit (HOSPITAL_COMMUNITY): Payer: Self-pay

## 2022-03-08 ENCOUNTER — Other Ambulatory Visit (HOSPITAL_COMMUNITY): Payer: Self-pay

## 2022-03-09 ENCOUNTER — Encounter: Payer: Self-pay | Admitting: Medical-Surgical

## 2022-03-09 MED ORDER — FLUCONAZOLE 150 MG PO TABS: 150.0000 mg | ORAL_TABLET | Freq: Once | ORAL | 0 refills | Status: AC

## 2022-03-16 ENCOUNTER — Other Ambulatory Visit: Payer: Self-pay

## 2022-03-16 ENCOUNTER — Other Ambulatory Visit: Payer: Self-pay | Admitting: Medical-Surgical

## 2022-03-16 ENCOUNTER — Other Ambulatory Visit (HOSPITAL_COMMUNITY): Payer: Self-pay

## 2022-03-16 MED ORDER — ALBUTEROL SULFATE HFA 108 (90 BASE) MCG/ACT IN AERS
2.0000 | INHALATION_SPRAY | Freq: Four times a day (QID) | RESPIRATORY_TRACT | 0 refills | Status: DC | PRN
  Filled 2022-03-16: qty 20.1, 75d supply, fill #0

## 2022-03-29 ENCOUNTER — Other Ambulatory Visit: Payer: Self-pay | Admitting: Medical-Surgical

## 2022-03-30 ENCOUNTER — Other Ambulatory Visit: Payer: Self-pay

## 2022-03-30 MED ORDER — FENOFIBRATE 145 MG PO TABS
145.0000 mg | ORAL_TABLET | Freq: Every day | ORAL | 1 refills | Status: DC
  Filled 2022-03-30: qty 90, 90d supply, fill #0
  Filled 2022-07-08: qty 90, 90d supply, fill #1

## 2022-03-31 ENCOUNTER — Other Ambulatory Visit: Payer: Self-pay

## 2022-04-15 ENCOUNTER — Other Ambulatory Visit (HOSPITAL_COMMUNITY): Payer: Self-pay

## 2022-05-06 ENCOUNTER — Other Ambulatory Visit: Payer: Self-pay

## 2022-05-06 ENCOUNTER — Other Ambulatory Visit: Payer: Self-pay | Admitting: Medical-Surgical

## 2022-05-06 ENCOUNTER — Encounter: Payer: Self-pay | Admitting: Medical-Surgical

## 2022-05-06 MED ORDER — OZEMPIC (0.25 OR 0.5 MG/DOSE) 2 MG/3ML ~~LOC~~ SOPN
0.5000 mg | PEN_INJECTOR | SUBCUTANEOUS | 1 refills | Status: DC
  Filled 2022-05-06: qty 3, 28d supply, fill #0
  Filled 2022-06-01: qty 3, 28d supply, fill #1

## 2022-05-07 ENCOUNTER — Other Ambulatory Visit (HOSPITAL_COMMUNITY): Payer: Self-pay

## 2022-05-07 ENCOUNTER — Other Ambulatory Visit: Payer: Self-pay

## 2022-05-07 MED ORDER — CITALOPRAM HYDROBROMIDE 40 MG PO TABS
40.0000 mg | ORAL_TABLET | Freq: Every day | ORAL | 0 refills | Status: DC
  Filled 2022-05-07 – 2022-07-12 (×3): qty 90, 90d supply, fill #0

## 2022-05-07 MED ORDER — ALBUTEROL SULFATE HFA 108 (90 BASE) MCG/ACT IN AERS
2.0000 | INHALATION_SPRAY | Freq: Four times a day (QID) | RESPIRATORY_TRACT | 0 refills | Status: DC | PRN
  Filled 2022-05-07 – 2022-06-01 (×2): qty 20.1, 75d supply, fill #0

## 2022-06-01 ENCOUNTER — Other Ambulatory Visit (HOSPITAL_COMMUNITY): Payer: Self-pay

## 2022-06-01 ENCOUNTER — Other Ambulatory Visit: Payer: Self-pay

## 2022-06-18 ENCOUNTER — Other Ambulatory Visit (HOSPITAL_COMMUNITY): Payer: Self-pay

## 2022-06-18 ENCOUNTER — Encounter: Payer: Self-pay | Admitting: Medical-Surgical

## 2022-06-18 MED ORDER — MOUNJARO 2.5 MG/0.5ML ~~LOC~~ SOAJ
2.5000 mg | SUBCUTANEOUS | 0 refills | Status: DC
  Filled 2022-06-18: qty 2, 28d supply, fill #0

## 2022-06-18 MED ORDER — MOUNJARO 2.5 MG/0.5ML ~~LOC~~ SOAJ: 2.5000 mg | SUBCUTANEOUS | 0 refills | Status: DC

## 2022-06-18 NOTE — Addendum Note (Signed)
Addended by: Annamaria Helling on: 06/18/2022 03:42 PM   Modules accepted: Orders

## 2022-07-08 ENCOUNTER — Other Ambulatory Visit (HOSPITAL_COMMUNITY): Payer: Self-pay

## 2022-07-12 ENCOUNTER — Other Ambulatory Visit: Payer: Self-pay

## 2022-07-12 ENCOUNTER — Encounter: Payer: Self-pay | Admitting: Medical-Surgical

## 2022-07-12 MED ORDER — TIRZEPATIDE 5 MG/0.5ML ~~LOC~~ SOAJ: 5.0000 mg | SUBCUTANEOUS | 0 refills | Status: DC

## 2022-07-13 ENCOUNTER — Other Ambulatory Visit: Payer: Self-pay

## 2022-07-13 ENCOUNTER — Other Ambulatory Visit (HOSPITAL_COMMUNITY): Payer: Self-pay

## 2022-07-13 MED ORDER — TIRZEPATIDE 5 MG/0.5ML ~~LOC~~ SOAJ
5.0000 mg | SUBCUTANEOUS | 0 refills | Status: DC
  Filled 2022-07-13 – 2022-07-21 (×3): qty 2, 28d supply, fill #0

## 2022-07-13 NOTE — Addendum Note (Signed)
Addended by: Chalmers Cater on: 07/13/2022 07:54 AM   Modules accepted: Orders

## 2022-07-21 ENCOUNTER — Other Ambulatory Visit (HOSPITAL_COMMUNITY): Payer: Self-pay

## 2022-07-21 ENCOUNTER — Other Ambulatory Visit: Payer: Self-pay

## 2022-07-22 ENCOUNTER — Other Ambulatory Visit: Payer: Self-pay

## 2022-07-22 ENCOUNTER — Other Ambulatory Visit (HOSPITAL_COMMUNITY): Payer: Self-pay

## 2022-07-22 ENCOUNTER — Other Ambulatory Visit: Payer: Self-pay | Admitting: Medical-Surgical

## 2022-07-22 MED ORDER — ALBUTEROL SULFATE HFA 108 (90 BASE) MCG/ACT IN AERS
2.0000 | INHALATION_SPRAY | Freq: Four times a day (QID) | RESPIRATORY_TRACT | 0 refills | Status: DC | PRN
  Filled 2022-07-22 – 2022-08-13 (×2): qty 20.1, 75d supply, fill #0

## 2022-07-26 ENCOUNTER — Other Ambulatory Visit (HOSPITAL_COMMUNITY): Payer: Self-pay

## 2022-08-01 ENCOUNTER — Other Ambulatory Visit: Payer: Self-pay | Admitting: Medical-Surgical

## 2022-08-03 ENCOUNTER — Other Ambulatory Visit: Payer: Self-pay

## 2022-08-03 MED ORDER — FLUTICASONE PROPIONATE 50 MCG/ACT NA SUSP
2.0000 | Freq: Every day | NASAL | 3 refills | Status: AC
  Filled 2022-08-03: qty 16, 30d supply, fill #0
  Filled 2022-09-01: qty 16, 30d supply, fill #1
  Filled 2022-09-29: qty 16, 30d supply, fill #2

## 2022-08-10 ENCOUNTER — Ambulatory Visit: Payer: Commercial Managed Care - PPO | Admitting: Medical-Surgical

## 2022-08-10 ENCOUNTER — Encounter: Payer: Self-pay | Admitting: Medical-Surgical

## 2022-08-10 VITALS — BP 116/80 | HR 73 | Temp 99.6°F | Ht 66.5 in | Wt 212.0 lb

## 2022-08-10 DIAGNOSIS — E119 Type 2 diabetes mellitus without complications: Secondary | ICD-10-CM | POA: Diagnosis not present

## 2022-08-10 DIAGNOSIS — I152 Hypertension secondary to endocrine disorders: Secondary | ICD-10-CM | POA: Diagnosis not present

## 2022-08-10 DIAGNOSIS — E1159 Type 2 diabetes mellitus with other circulatory complications: Secondary | ICD-10-CM | POA: Diagnosis not present

## 2022-08-10 DIAGNOSIS — Z23 Encounter for immunization: Secondary | ICD-10-CM | POA: Diagnosis not present

## 2022-08-10 DIAGNOSIS — F418 Other specified anxiety disorders: Secondary | ICD-10-CM | POA: Diagnosis not present

## 2022-08-10 DIAGNOSIS — K219 Gastro-esophageal reflux disease without esophagitis: Secondary | ICD-10-CM | POA: Diagnosis not present

## 2022-08-10 DIAGNOSIS — E559 Vitamin D deficiency, unspecified: Secondary | ICD-10-CM

## 2022-08-10 DIAGNOSIS — E785 Hyperlipidemia, unspecified: Secondary | ICD-10-CM

## 2022-08-10 DIAGNOSIS — E1169 Type 2 diabetes mellitus with other specified complication: Secondary | ICD-10-CM

## 2022-08-10 LAB — POCT GLYCOSYLATED HEMOGLOBIN (HGB A1C): Hemoglobin A1C: 7 % — AB (ref 4.0–5.6)

## 2022-08-10 NOTE — Progress Notes (Signed)
        Established patient visit  History, exam, impression, and plan:  1. Hypertension associated with diabetes [facility]) Pleasant 52 year old female with history of hypertension presenting today for follow-up.  Not currently on blood pressure medications and does not regularly monitor blood pressure at home.  Stays physically active as tolerated.  Following low-sodium diet.  Denies CP, SOB, LE edema, HA, palpitations, and dizziness.  HRR, S1/S2 normal.  Lungs CTA.  No peripheral edema.  Checking labs today.  Blood pressure elevated on arrival however recheck perfectly at goal.  Recommend monitoring blood pressure at home 2-3 times weekly with a goal of 130/80 or less. - CBC with Differential/Platelet - COMPLETE METABOLIC PANEL WITH GFR - Lipid panel  2. Hyperlipidemia associated with type 2 diabetes mellitus (HCC) Currently taking fenofibrate 145 mg daily, tolerating well without side effects.  Following a low-fat heart healthy diet.  Checking lipids today.  3. Type 2 diabetes mellitus without complication, without long-term current use of insulin (HCC) Taking Mounjaro 5 mg daily, tolerating well overall however she has had some recent GI upset including diarrhea and abdominal cramps.  Her GI symptoms have been over the last 2 weeks.  Denies nausea, vomiting.  Eating and drinking well.  Not checking sugars although she was previously.  Her last sugars were in the 120 range fasting.  POCT hemoglobin A1c 6 months ago 6.7%.  Today, hemoglobin A1c at 7%.  Continue Mounjaro 5 mg daily.  Unclear if her GI symptoms are directly related to [facility] or if it is a viral illness given her temperature is 99.6 today.  Recommend checking sugars at home 2-3 times weekly with a fasting goal of 120 or less. - POCT HgB A1C  4. Vitamin D deficiency Checking vitamin D today. - VITAMIN D 25 Hydroxy (Vit-D Deficiency, Fractures)  5. Anxiety with depression Doing well on Celexa 40 mg daily.  Has BuSpar that she uses  15 mg once daily on average.  Is trying to take herself off of BuSpar in an effort to reduce pill burden.  Denies SI/HI.  Mood normal with normal thought pattern, cognition, and affect.  Continue Celexa and BuSpar as prescribed.  6. Gastroesophageal reflux disease without esophagitis Has been taking Prevacid 30 mg daily, tolerating well without side effects.  Trying to come off this medication as well.  Started an herbal option for probiotics and feels that it is working well so we will likely discontinue Prevacid on her own.  7.  Need for Tdap vaccination Tdap given in office today.  Procedures performed this visit: None.  Return in about 6 months (around 02/10/2023) for DM/HTN/HLD follow up.  __________________________________ Thayer Ohm, DNP, APRN, FNP-BC Primary Care and Sports Medicine [facility] [place] [facility]

## 2022-08-10 NOTE — Addendum Note (Signed)
Addended by: Janeece Riggers D on: 08/10/2022 08:50 AM   Modules accepted: Orders

## 2022-08-11 ENCOUNTER — Encounter: Payer: Self-pay | Admitting: Medical-Surgical

## 2022-08-11 LAB — CBC WITH DIFFERENTIAL/PLATELET
Absolute Monocytes: 592 cells/uL (ref 200–950)
Basophils Absolute: 44 cells/uL (ref 0–200)
Basophils Relative: 0.5 %
Eosinophils Absolute: 278 cells/uL (ref 15–500)
Eosinophils Relative: 3.2 %
HCT: 44.5 % (ref 35.0–45.0)
Hemoglobin: 14.4 g/dL (ref 11.7–15.5)
Lymphs Abs: 3341 cells/uL (ref 850–3900)
MCH: 27.8 pg (ref 27.0–33.0)
MCHC: 32.4 g/dL (ref 32.0–36.0)
MCV: 85.9 fL (ref 80.0–100.0)
MPV: 11.7 fL (ref 7.5–12.5)
Monocytes Relative: 6.8 %
Neutro Abs: 4446 cells/uL (ref 1500–7800)
Neutrophils Relative %: 51.1 %
Platelets: 302 10*3/uL (ref 140–400)
RBC: 5.18 10*6/uL — ABNORMAL HIGH (ref 3.80–5.10)
RDW: 12.8 % (ref 11.0–15.0)
Total Lymphocyte: 38.4 %
WBC: 8.7 10*3/uL (ref 3.8–10.8)

## 2022-08-11 LAB — COMPLETE METABOLIC PANEL WITH GFR
AG Ratio: 2 (calc) (ref 1.0–2.5)
ALT: 19 U/L (ref 6–29)
AST: 20 U/L (ref 10–35)
Albumin: 4.7 g/dL (ref 3.6–5.1)
Alkaline phosphatase (APISO): 83 U/L (ref 37–153)
BUN/Creatinine Ratio: 17 (calc) (ref 6–22)
BUN: 19 mg/dL (ref 7–25)
CO2: 26 mmol/L (ref 20–32)
Calcium: 9.7 mg/dL (ref 8.6–10.4)
Chloride: 106 mmol/L (ref 98–110)
Creat: 1.1 mg/dL — ABNORMAL HIGH (ref 0.50–1.03)
Globulin: 2.3 g/dL (calc) (ref 1.9–3.7)
Glucose, Bld: 116 mg/dL — ABNORMAL HIGH (ref 65–99)
Potassium: 4.5 mmol/L (ref 3.5–5.3)
Sodium: 142 mmol/L (ref 135–146)
Total Bilirubin: 0.6 mg/dL (ref 0.2–1.2)
Total Protein: 7 g/dL (ref 6.1–8.1)
eGFR: 60 mL/min/{1.73_m2} (ref >=60)

## 2022-08-11 LAB — LIPID PANEL
Cholesterol: 189 mg/dL (ref <200)
HDL: 41 mg/dL — ABNORMAL LOW (ref >=50)
LDL Cholesterol (Calc): 111 mg/dL (calc) — ABNORMAL HIGH
Non-HDL Cholesterol (Calc): 148 mg/dL (calc) — ABNORMAL HIGH (ref <130)
Total CHOL/HDL Ratio: 4.6 (calc) (ref <5.0)
Triglycerides: 242 mg/dL — ABNORMAL HIGH (ref <150)

## 2022-08-11 LAB — VITAMIN D 25 HYDROXY (VIT D DEFICIENCY, FRACTURES): Vit D, 25-Hydroxy: 49 ng/mL (ref 30–100)

## 2022-08-11 MED ORDER — EZETIMIBE 10 MG PO TABS: 10.0000 mg | ORAL_TABLET | Freq: Every day | ORAL | 3 refills | Status: DC

## 2022-08-13 ENCOUNTER — Other Ambulatory Visit (HOSPITAL_COMMUNITY): Payer: Self-pay

## 2022-08-13 ENCOUNTER — Other Ambulatory Visit: Payer: Self-pay

## 2022-08-13 ENCOUNTER — Other Ambulatory Visit: Payer: Self-pay | Admitting: Medical-Surgical

## 2022-08-13 MED ORDER — EZETIMIBE 10 MG PO TABS
10.0000 mg | ORAL_TABLET | Freq: Every day | ORAL | 3 refills | Status: AC
  Filled 2022-08-13: qty 90, 90d supply, fill #0

## 2022-08-13 MED ORDER — MOUNJARO 5 MG/0.5ML ~~LOC~~ SOAJ
5.0000 mg | SUBCUTANEOUS | 0 refills | Status: DC
  Filled 2022-08-13 – 2022-08-16 (×3): qty 2, 28d supply, fill #0

## 2022-08-13 NOTE — Addendum Note (Signed)
Addended by: Chalmers Cater on: 08/13/2022 11:50 AM   Modules accepted: Orders

## 2022-08-16 ENCOUNTER — Other Ambulatory Visit (HOSPITAL_COMMUNITY): Payer: Self-pay

## 2022-08-16 ENCOUNTER — Encounter: Payer: Self-pay | Admitting: Pharmacist

## 2022-08-16 ENCOUNTER — Other Ambulatory Visit: Payer: Self-pay

## 2022-08-27 ENCOUNTER — Other Ambulatory Visit (HOSPITAL_COMMUNITY): Payer: Self-pay

## 2022-09-01 ENCOUNTER — Other Ambulatory Visit: Payer: Self-pay

## 2022-09-07 ENCOUNTER — Encounter: Payer: Self-pay | Admitting: Sports Medicine

## 2022-09-10 ENCOUNTER — Ambulatory Visit: Payer: Commercial Managed Care - PPO | Admitting: Sports Medicine

## 2022-09-10 ENCOUNTER — Other Ambulatory Visit (INDEPENDENT_AMBULATORY_CARE_PROVIDER_SITE_OTHER): Payer: Self-pay

## 2022-09-10 DIAGNOSIS — M7541 Impingement syndrome of right shoulder: Secondary | ICD-10-CM

## 2022-09-10 NOTE — Assessment & Plan Note (Signed)
Carolyn [name] returns, she is a very pleasant 52 year old female with chronic right shoulder pain, she had a subacromial injection back in June 2023, she also had a subacromial injection in [name] 2023, she has been consistent with her home physical therapy and is doing really well overall, she is now having recurrence of pain, she has impingement signs on exam, repeat subacromial injection today, return to see me as needed.

## 2022-09-10 NOTE — Progress Notes (Signed)
    Procedures performed today:    Procedure: Real-time Ultrasound Guided injection of the right subacromial bursa Device: Samsung HS60  Verbal informed consent obtained.  Time-out conducted.  Noted no overlying erythema, induration, or other signs of local infection.  Skin prepped in a sterile fashion.  Local anesthesia: Topical Ethyl chloride.  With sterile technique and under real time ultrasound guidance: Intact cuff noted, 1 cc Kenalog 40, 1 cc lidocaine, 1 cc bupivacaine injected easily Completed without difficulty  Advised to call if fevers/chills, erythema, induration, drainage, or persistent bleeding.  Images permanently stored and available for review in PACS.  Impression: Technically successful ultrasound guided injection.  Independent interpretation of notes and tests performed by another provider:   None.  Brief History, Exam, Impression, and Recommendations:    Impingement syndrome, shoulder, right Carolyn [name] returns, she is a very pleasant 52 year old female with chronic right shoulder pain, she had a subacromial injection back in June 2023, she also had a subacromial injection in [name] 2023, she has been consistent with her home physical therapy and is doing really well overall, she is now having recurrence of pain, she has impingement signs on exam, repeat subacromial injection today, return to see me as needed.    ____________________________________________ Ihor Austin. Benjamin Stain, M.D., ABFM., CAQSM., AME. Primary Care and Sports Medicine [facility]  Adjunct Professor of Family Medicine  [employer]  Restaurant manager, fast food

## 2022-09-14 ENCOUNTER — Other Ambulatory Visit: Payer: Self-pay | Admitting: Medical-Surgical

## 2022-09-14 ENCOUNTER — Other Ambulatory Visit (HOSPITAL_COMMUNITY): Payer: Self-pay

## 2022-09-14 MED ORDER — MOUNJARO 5 MG/0.5ML ~~LOC~~ SOAJ
5.0000 mg | SUBCUTANEOUS | 0 refills | Status: DC
  Filled 2022-09-14: qty 2, 28d supply, fill #0

## 2022-09-15 ENCOUNTER — Other Ambulatory Visit: Payer: Self-pay

## 2022-09-29 ENCOUNTER — Other Ambulatory Visit: Payer: Self-pay | Admitting: Medical-Surgical

## 2022-09-29 ENCOUNTER — Encounter: Payer: Self-pay | Admitting: Medical-Surgical

## 2022-09-29 ENCOUNTER — Other Ambulatory Visit (HOSPITAL_COMMUNITY): Payer: Self-pay

## 2022-09-29 ENCOUNTER — Other Ambulatory Visit: Payer: Self-pay

## 2022-09-29 MED ORDER — LANSOPRAZOLE 30 MG PO CPDR
30.0000 mg | DELAYED_RELEASE_CAPSULE | Freq: Every day | ORAL | 1 refills | Status: AC
  Filled 2022-09-29: qty 90, 90d supply, fill #0

## 2022-09-29 MED ORDER — FENOFIBRATE 145 MG PO TABS
145.0000 mg | ORAL_TABLET | Freq: Every day | ORAL | 1 refills | Status: AC
  Filled 2022-09-29: qty 90, 90d supply, fill #0

## 2022-09-29 MED ORDER — MOUNJARO 5 MG/0.5ML ~~LOC~~ SOAJ
5.0000 mg | SUBCUTANEOUS | 0 refills | Status: DC
  Filled 2022-09-29 – 2022-10-09 (×2): qty 2, 28d supply, fill #0

## 2022-09-29 MED ORDER — BUSPIRONE HCL 15 MG PO TABS
15.0000 mg | ORAL_TABLET | Freq: Two times a day (BID) | ORAL | 1 refills | Status: AC
  Filled 2022-09-29: qty 180, 90d supply, fill #0

## 2022-09-29 MED ORDER — ALBUTEROL SULFATE HFA 108 (90 BASE) MCG/ACT IN AERS
2.0000 | INHALATION_SPRAY | Freq: Four times a day (QID) | RESPIRATORY_TRACT | 0 refills | Status: AC | PRN
  Filled 2022-09-29: qty 20.1, 75d supply, fill #0

## 2022-09-29 MED ORDER — CITALOPRAM HYDROBROMIDE 40 MG PO TABS
40.0000 mg | ORAL_TABLET | Freq: Every day | ORAL | 0 refills | Status: DC
  Filled 2022-09-29 – 2022-10-09 (×2): qty 90, 90d supply, fill #0

## 2022-10-01 ENCOUNTER — Other Ambulatory Visit (HOSPITAL_COMMUNITY): Payer: Self-pay

## 2022-10-01 MED ORDER — PREGABALIN 100 MG PO CAPS
100.0000 mg | ORAL_CAPSULE | Freq: Three times a day (TID) | ORAL | 1 refills | Status: AC
  Filled 2022-10-01: qty 270, 90d supply, fill #0

## 2022-10-01 NOTE — Telephone Encounter (Signed)
Last OV: 08/10/22 Next OV: 11/12/22 Last RF: 03/04/22

## 2022-10-02 ENCOUNTER — Other Ambulatory Visit (HOSPITAL_COMMUNITY): Payer: Self-pay

## 2022-10-04 ENCOUNTER — Other Ambulatory Visit: Payer: Self-pay

## 2022-10-11 ENCOUNTER — Other Ambulatory Visit: Payer: Self-pay

## 2022-11-09 ENCOUNTER — Other Ambulatory Visit (HOSPITAL_COMMUNITY): Payer: Self-pay

## 2022-11-09 ENCOUNTER — Other Ambulatory Visit: Payer: Self-pay | Admitting: Medical-Surgical

## 2022-11-09 MED ORDER — MOUNJARO 5 MG/0.5ML ~~LOC~~ SOAJ: 5.0000 mg | SUBCUTANEOUS | 0 refills | Status: DC

## 2022-11-09 MED ORDER — CITALOPRAM HYDROBROMIDE 40 MG PO TABS: 40.0000 mg | ORAL_TABLET | Freq: Every day | ORAL | 0 refills | Status: DC

## 2022-11-12 ENCOUNTER — Telehealth: Payer: Self-pay

## 2022-11-12 ENCOUNTER — Encounter: Payer: Self-pay | Admitting: Medical-Surgical

## 2022-11-12 ENCOUNTER — Encounter: Payer: 59 | Admitting: Medical-Surgical

## 2022-11-12 NOTE — Telephone Encounter (Signed)
PA pending

## 2022-11-19 ENCOUNTER — Other Ambulatory Visit: Payer: Self-pay | Admitting: Medical-Surgical

## 2022-11-19 ENCOUNTER — Telehealth: Payer: Self-pay | Admitting: Medical-Surgical

## 2022-11-19 MED ORDER — MOUNJARO 5 MG/0.5ML ~~LOC~~ SOAJ: 5.0000 mg | SUBCUTANEOUS | 0 refills | Status: DC

## 2022-11-19 NOTE — Telephone Encounter (Signed)
Pharmacy called to request the status on the PA on [facility] 5mg  please advise  [facility] Phone [phone]

## 2022-11-22 NOTE — Telephone Encounter (Signed)
PA is pending from 11/12/2022

## 2022-11-23 ENCOUNTER — Telehealth: Payer: Self-pay

## 2022-11-23 ENCOUNTER — Encounter: Payer: Self-pay | Admitting: Medical-Surgical

## 2022-11-23 NOTE — Telephone Encounter (Signed)
Initiated Prior authorization [id number] 5MG /0.5ML pen-injectors Via: Covermymeds Case/Key:[id number] Status: approved  as of 8/827/24 Reason:Authorization Expiration Date: 11/23/2023  Notified Pt via: Mychart

## 2022-12-02 IMAGING — DX DG SHOULDER 2+V*R*
3 series · 3 of 3 positions shown · non-contrast
Comparison: None.

CLINICAL DATA: Right anterior shoulder joint pain over the deltoid
with swelling related to a trauma accident 6 weeks ago.

EXAM:
RIGHT SHOULDER - 2+ VIEW

[shoulder grashey]
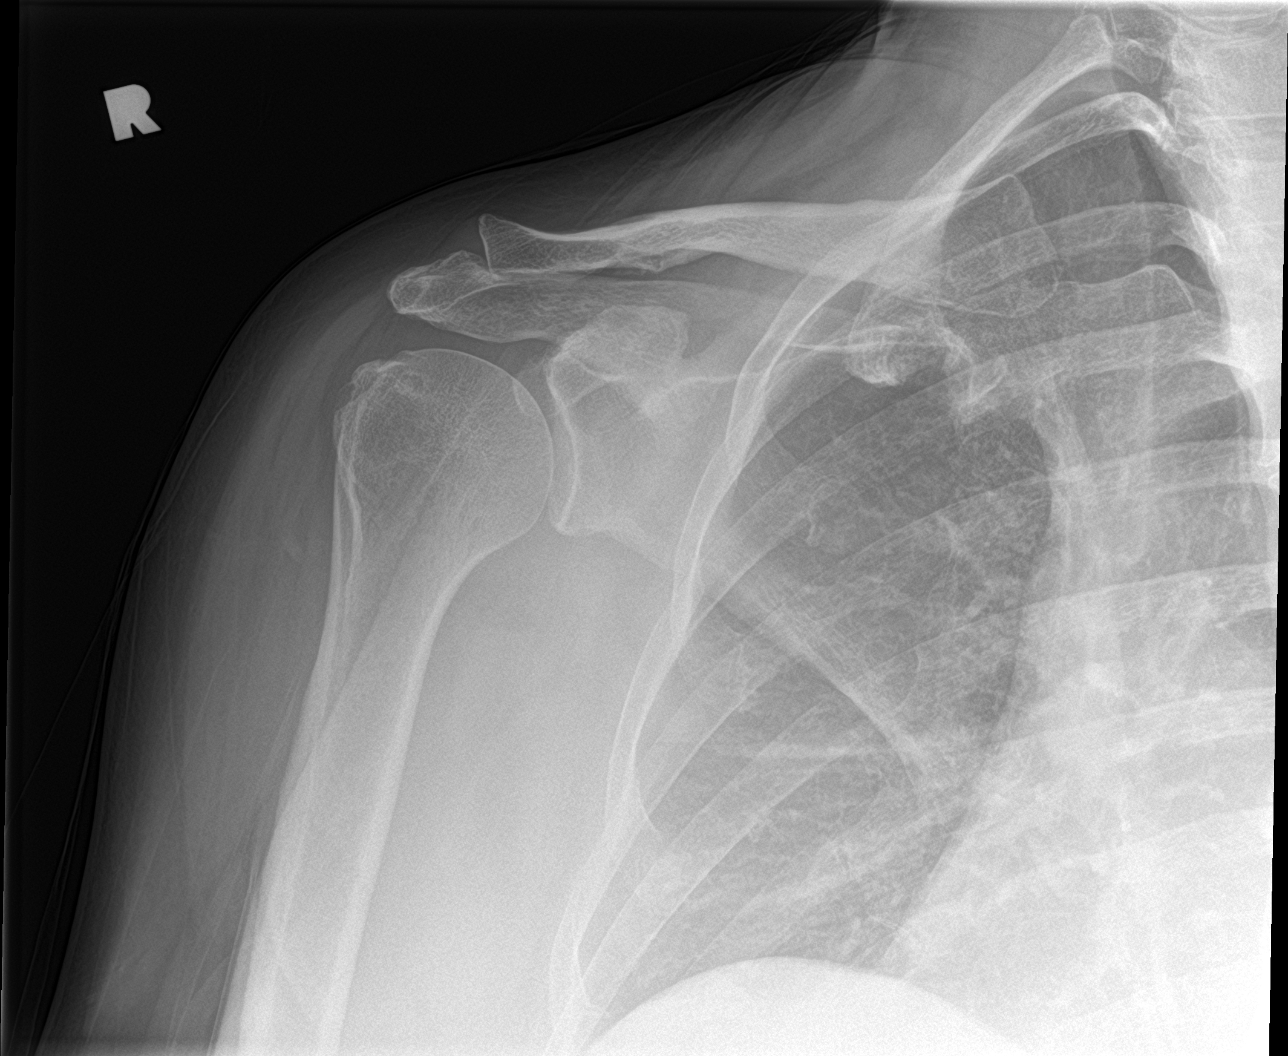

[shoulder y view]
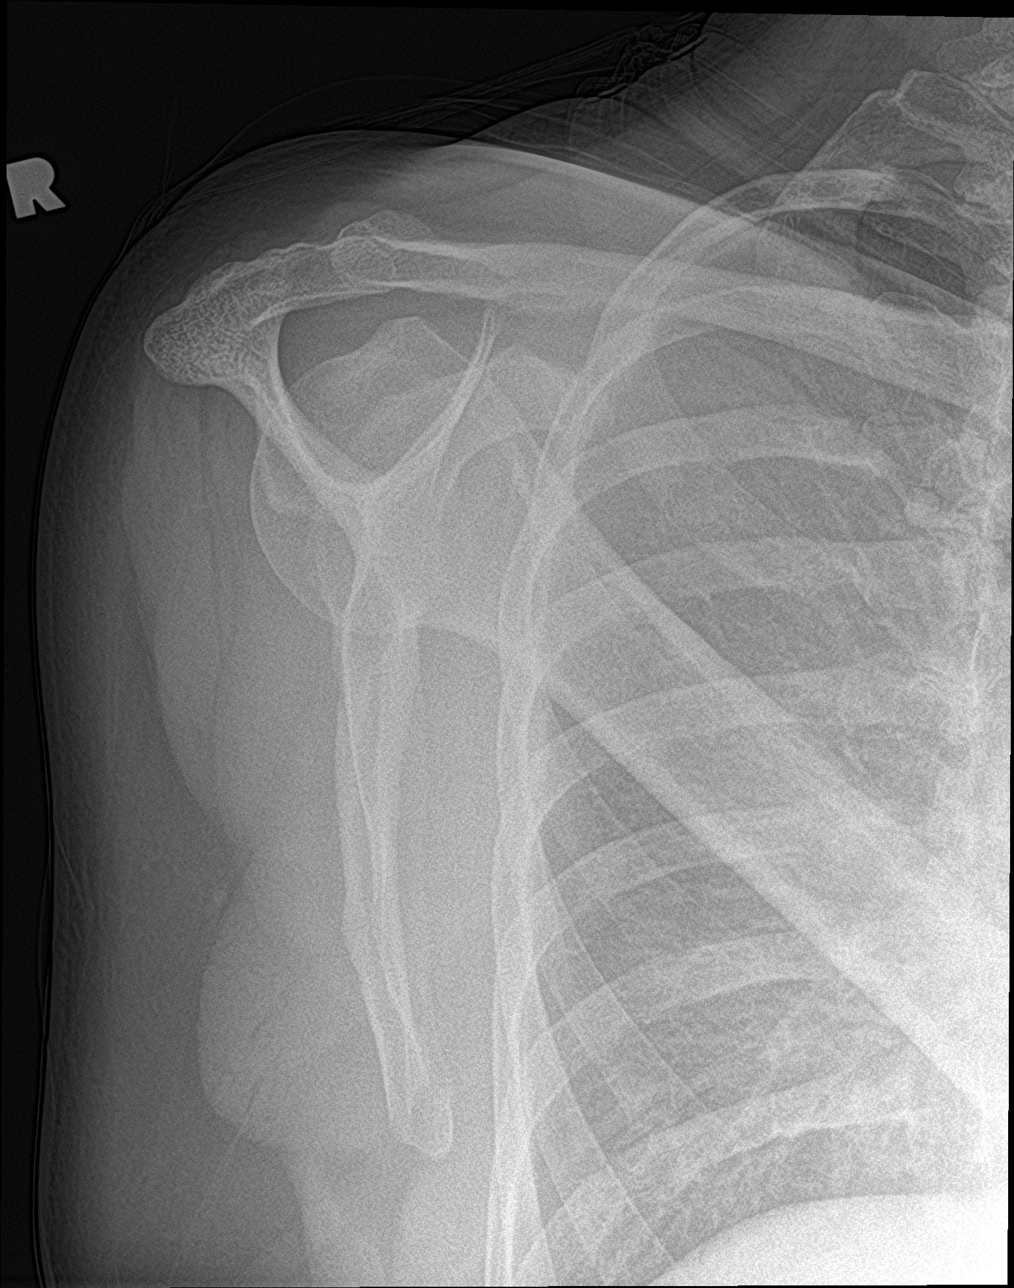

[shoulder axillary]
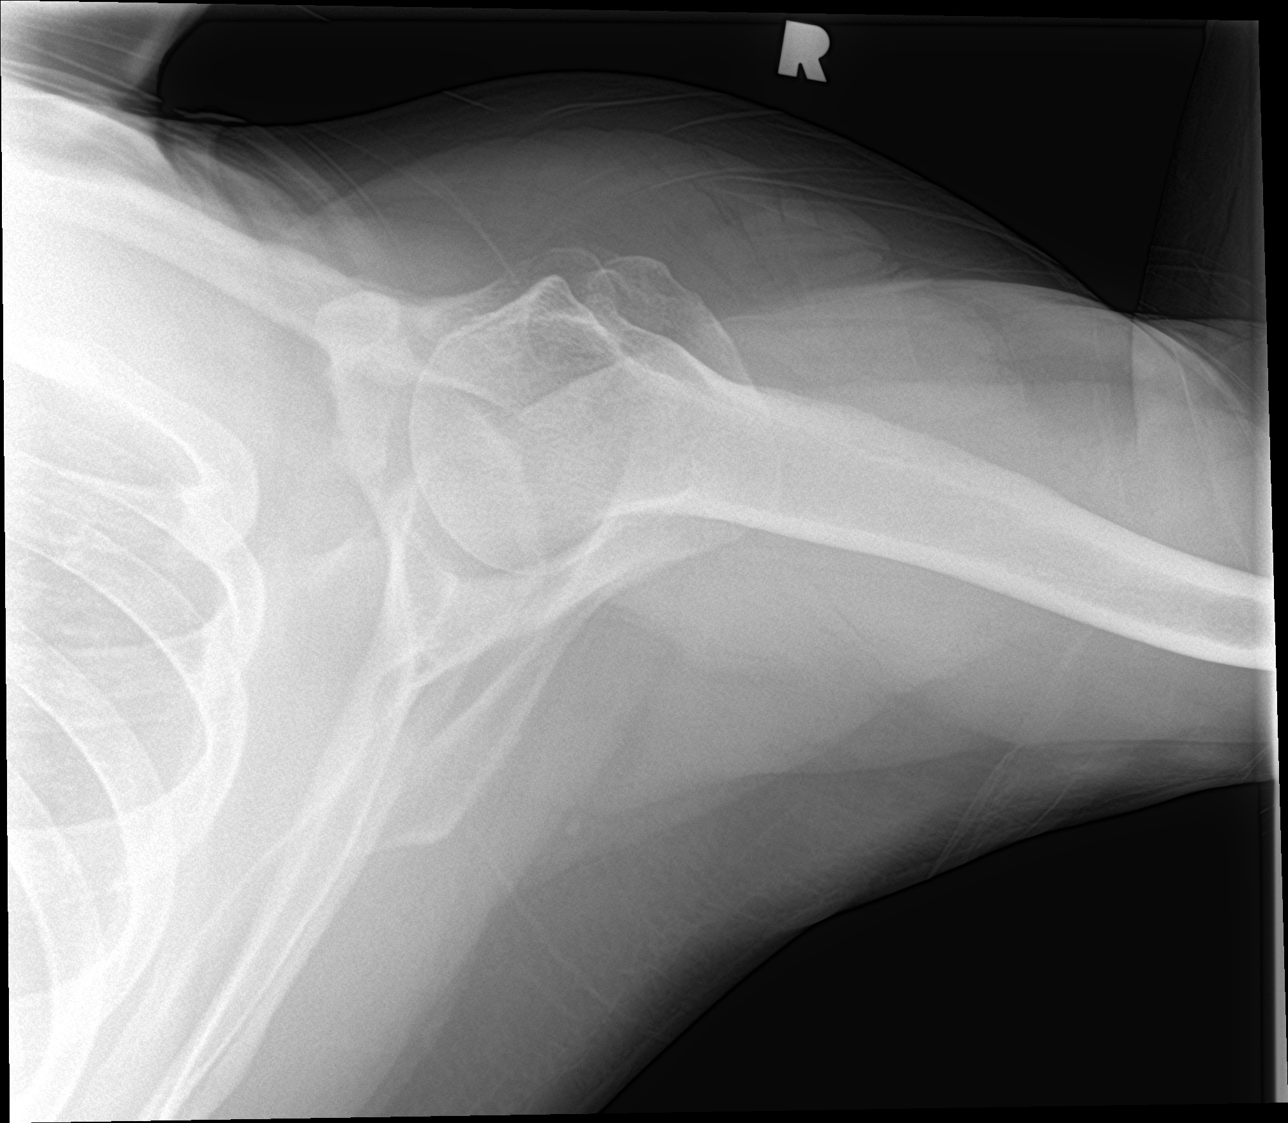

[3 of 3 positions shown; findings below may reference images not displayed]

FINDINGS: There is mild curvilinear lucency within the superolateral aspect of
the humeral head at the greater tuberosity on frontal view. This is
not well visualized on other views but raises the question of an
acute nondisplaced fracture given the patient's history of a fall
SUV about 6 weeks ago.

Moderate inferior acromioclavicular joint space narrowing with mild
peripheral degenerative osteophytosis.

No dislocation. The visualized portion of the right lung is
unremarkable.
IMPRESSION: :
IMPRESSION: 1. Mild curvilinear lucency within the superolateral aspect of the
humeral head at the greater tuberosity seen on frontal view only.
This raises the question of acute nondisplaced fracture given the
patient's history of trauma about 6 weeks ago.
2. Mild acromioclavicular osteoarthritis.

## 2022-12-03 NOTE — Telephone Encounter (Signed)
APPROVED

## 2022-12-20 ENCOUNTER — Other Ambulatory Visit: Payer: Self-pay | Admitting: Medical-Surgical

## 2022-12-20 MED ORDER — MOUNJARO 5 MG/0.5ML ~~LOC~~ SOAJ: 5.0000 mg | SUBCUTANEOUS | 1 refills | Status: AC

## 2022-12-20 NOTE — Telephone Encounter (Signed)
Requesting rx rf of mounjaro 5mg   Last written 11/19/2022 Last OV

## 2022-12-20 NOTE — Telephone Encounter (Signed)
Requesting rx rf of mounjaro 5mg  -  Increase ? Last written 11/19/2022 Last OV 08/10/2022 Next schld appt 02/11/2023

## 2023-02-11 ENCOUNTER — Ambulatory Visit: Payer: Commercial Managed Care - PPO | Admitting: Medical-Surgical

## 2023-02-16 ENCOUNTER — Other Ambulatory Visit: Payer: Self-pay | Admitting: Medical-Surgical

## 2023-04-05 ENCOUNTER — Ambulatory Visit: Payer: PRIVATE HEALTH INSURANCE | Admitting: Medical-Surgical

## 2023-04-13 ENCOUNTER — Other Ambulatory Visit: Payer: Self-pay | Admitting: Medical-Surgical

## 2023-08-29 LAB — COLOGUARD: COLOGUARD: POSITIVE — AB

## 2023-10-26 ENCOUNTER — Other Ambulatory Visit (HOSPITAL_COMMUNITY): Payer: Self-pay

## 2023-10-26 ENCOUNTER — Telehealth: Payer: Self-pay

## 2023-10-26 NOTE — Telephone Encounter (Signed)
 Pharmacy Patient Advocate Encounter   Received notification from CoverMyMeds that prior authorization for Mounjaro  5mg /0.29ml is due for renewal.   Insurance verification completed.   The patient is insured through [employer].  Action: Patient hasn't been seen in your office in over a year. Plan requires updated chart notes for PA renewal.  Per chart it looks like the patient is going to [facility].   Please advise.

## 2023-10-26 NOTE — Telephone Encounter (Signed)
 Duplicate/error

## 2023-10-27 ENCOUNTER — Other Ambulatory Visit (HOSPITAL_COMMUNITY): Payer: Self-pay

## 2023-11-29 ENCOUNTER — Encounter: Payer: Self-pay | Admitting: Sports Medicine
